# Patient Record
Sex: Male | Born: 1960 | Race: White | Hispanic: No | Smoking: Former smoker
Health system: Southern US, Community
[De-identification: ages and names within clinical notes are randomized; demographics above are authoritative.]

## PROBLEM LIST (undated history)

## (undated) DIAGNOSIS — M1990 Unspecified osteoarthritis, unspecified site: Secondary | ICD-10-CM

## (undated) DIAGNOSIS — R002 Palpitations: Secondary | ICD-10-CM

## (undated) DIAGNOSIS — F319 Bipolar disorder, unspecified: Secondary | ICD-10-CM

## (undated) DIAGNOSIS — K219 Gastro-esophageal reflux disease without esophagitis: Secondary | ICD-10-CM

## (undated) DIAGNOSIS — R0789 Other chest pain: Secondary | ICD-10-CM

## (undated) DIAGNOSIS — K759 Inflammatory liver disease, unspecified: Secondary | ICD-10-CM

## (undated) HISTORY — DX: Palpitations: R00.2

## (undated) HISTORY — PX: BACK SURGERY: SHX140

## (undated) HISTORY — PX: APPENDECTOMY: SHX54

## (undated) HISTORY — PX: COLONOSCOPY: SHX174

## (undated) HISTORY — PX: REPAIR OF FRACTURED PENIS: SHX6063

## (undated) HISTORY — DX: Other chest pain: R07.89

---

## 2008-04-14 ENCOUNTER — Observation Stay: Payer: Self-pay | Admitting: Internal Medicine

## 2008-04-14 ENCOUNTER — Other Ambulatory Visit: Payer: Self-pay

## 2011-09-17 ENCOUNTER — Other Ambulatory Visit: Payer: Self-pay | Admitting: Family Medicine

## 2011-09-17 DIAGNOSIS — N5089 Other specified disorders of the male genital organs: Secondary | ICD-10-CM

## 2011-09-18 ENCOUNTER — Other Ambulatory Visit: Payer: Self-pay

## 2011-09-19 ENCOUNTER — Ambulatory Visit
Admission: RE | Admit: 2011-09-19 | Discharge: 2011-09-19 | Disposition: A | Discharge status: Home / Self Care | Payer: PRIVATE HEALTH INSURANCE | Source: Ambulatory Visit | Attending: Family Medicine | Admitting: Family Medicine

## 2011-09-19 DIAGNOSIS — N5089 Other specified disorders of the male genital organs: Secondary | ICD-10-CM

## 2012-10-22 ENCOUNTER — Ambulatory Visit
Admission: RE | Admit: 2012-10-22 | Discharge: 2012-10-22 | Disposition: A | Discharge status: Home / Self Care | Payer: Self-pay | Source: Ambulatory Visit | Attending: Physician Assistant | Admitting: Physician Assistant

## 2012-10-22 ENCOUNTER — Other Ambulatory Visit: Payer: Self-pay | Admitting: Physician Assistant

## 2012-10-22 DIAGNOSIS — M545 Low back pain, unspecified: Secondary | ICD-10-CM

## 2012-11-30 ENCOUNTER — Encounter: Payer: PRIVATE HEALTH INSURANCE | Admitting: Internal Medicine

## 2015-10-14 ENCOUNTER — Emergency Department (HOSPITAL_COMMUNITY): Payer: BLUE CROSS/BLUE SHIELD

## 2015-10-14 ENCOUNTER — Emergency Department (HOSPITAL_COMMUNITY)
Admission: EM | Admit: 2015-10-14 | Discharge: 2015-10-14 | Disposition: A | Discharge status: Home / Self Care | Payer: BLUE CROSS/BLUE SHIELD | Attending: Emergency Medicine | Admitting: Emergency Medicine

## 2015-10-14 ENCOUNTER — Encounter (HOSPITAL_COMMUNITY): Payer: Self-pay | Admitting: Emergency Medicine

## 2015-10-14 DIAGNOSIS — Z79899 Other long term (current) drug therapy: Secondary | ICD-10-CM | POA: Diagnosis not present

## 2015-10-14 DIAGNOSIS — F319 Bipolar disorder, unspecified: Secondary | ICD-10-CM | POA: Diagnosis not present

## 2015-10-14 DIAGNOSIS — Z87891 Personal history of nicotine dependence: Secondary | ICD-10-CM | POA: Diagnosis not present

## 2015-10-14 DIAGNOSIS — Z7982 Long term (current) use of aspirin: Secondary | ICD-10-CM | POA: Diagnosis not present

## 2015-10-14 DIAGNOSIS — R002 Palpitations: Secondary | ICD-10-CM | POA: Diagnosis not present

## 2015-10-14 LAB — I-STAT TROPONIN, ED: TROPONIN I, POC: 0 ng/mL (ref 0.00–0.08)

## 2015-10-14 LAB — BASIC METABOLIC PANEL
Anion gap: 10 (ref 5–15)
BUN: 12 mg/dL (ref 6–20)
CO2: 25 mmol/L (ref 22–32)
CREATININE: 1.02 mg/dL (ref 0.61–1.24)
Calcium: 8.7 mg/dL — ABNORMAL LOW (ref 8.9–10.3)
Chloride: 105 mmol/L (ref 101–111)
GFR calc Af Amer: >60 mL/min (ref >60)
GFR calc non Af Amer: >60 mL/min (ref >60)
GLUCOSE: 179 mg/dL — AB (ref 65–99)
POTASSIUM: 3.4 mmol/L — AB (ref 3.5–5.1)
SODIUM: 140 mmol/L (ref 135–145)

## 2015-10-14 LAB — CBC
HCT: 43.3 % (ref 39.0–52.0)
HEMOGLOBIN: 14.9 g/dL (ref 13.0–17.0)
MCH: 30.7 pg (ref 26.0–34.0)
MCHC: 34.4 g/dL (ref 30.0–36.0)
MCV: 89.1 fL (ref 78.0–100.0)
Platelets: 174 10*3/uL (ref 150–400)
RBC: 4.86 MIL/uL (ref 4.22–5.81)
RDW: 12.6 % (ref 11.5–15.5)
WBC: 7.4 10*3/uL (ref 4.0–10.5)

## 2015-10-14 NOTE — ED Provider Notes (Signed)
CSN: [id number]     Arrival date & time 10/14/15  1615 History   First MD Initiated Contact with Patient 10/14/15 1948     Chief Complaint  Patient presents with  . Palpitations     (Consider location/radiation/quality/duration/timing/severity/associated sxs/prior Treatment) HPI Mr. Barry Davis is a 55 year old man with a history of bipolar disorder who comes today complaining of episodes of palpitations. He states that he began having these several weeks ago and has had 4 episodes. Last episode woke him up at 2 AM last night. He states it felt like his heart was pounding. He did not have associated chest pain, dizziness, lightheadedness, or weakness. This is similar to the other episodes he has had. His physician has started him on Toprol 25 mg recently. He takes Seroquel at bedtime. He has been taking the Seroquel for an extended period time. He had a history of smoking but quit last April. He states that he was drinking caffeine and had some palpitations after that but quit drinking caffeine with to go after that occurred. He denies any illicit drugs. He denies alcohol. History reviewed. No pertinent past medical history. Past Surgical History  Procedure Laterality Date  . Appendectomy    . Repair of fractured penis    . Back surgery     No family history on file. Social History  Substance Use Topics  . Smoking status: Former Games developer  . Smokeless tobacco: None  . Alcohol Use: No    Review of Systems  All other systems reviewed and are negative.     Allergies  Sulfa antibiotics  Home Medications   Prior to Admission medications   Medication Sig Start Date End Date Taking? Authorizing Provider  aspirin EC 81 MG tablet Take 81 mg by mouth daily.   Yes Historical Provider, MD  Guaifenesin (MUCINEX MAXIMUM STRENGTH) 1200 MG TB12 Take 1,200 mg by mouth every 12 (twelve) hours.   Yes Historical Provider, MD  metoprolol succinate (TOPROL-XL) 25 MG 24 hr tablet Take 25 mg by mouth  daily. To prevent palpitations 10/04/15  Yes Historical Provider, MD  Multiple Vitamins-Minerals (PRESERVISION AREDS 2 PO) Take 1 tablet by mouth 2 (two) times daily.   Yes Historical Provider, MD  QUEtiapine (SEROQUEL) 300 MG tablet Take 300 mg by mouth at bedtime. 10/06/15  Yes Historical Provider, MD   BP 143/92 mmHg  Pulse 83  Temp(Src) 98.7 F (37.1 C) (Oral)  Resp 13  SpO2 100% Physical Exam  Constitutional: He is oriented to person, place, and time. He appears well-developed and well-nourished.  HENT:  Head: Normocephalic and atraumatic.  Right Ear: External ear normal.  Left Ear: External ear normal.  Nose: Nose normal.  Mouth/Throat: Oropharynx is clear and moist.  Eyes: Conjunctivae and EOM are normal. Pupils are equal, round, and reactive to light.  Neck: Normal range of motion. Neck supple.  Cardiovascular: Normal rate, regular rhythm, normal heart sounds and intact distal pulses.   Pulmonary/Chest: Effort normal and breath sounds normal. No respiratory distress. He has no wheezes. He exhibits no tenderness.  Abdominal: Soft. Bowel sounds are normal. He exhibits no distension and no mass. There is no tenderness. There is no guarding.  Musculoskeletal: Normal range of motion.  Neurological: He is alert and oriented to person, place, and time. He has normal reflexes. He exhibits normal muscle tone. Coordination normal.  Skin: Skin is warm and dry.  Psychiatric: He has a normal mood and affect. His behavior is normal. Judgment and thought content normal.  Nursing note and vitals reviewed.   ED Course  Procedures (including critical care time) Labs Review Labs Reviewed  BASIC METABOLIC PANEL - Abnormal; Notable for the following:    Potassium 3.4 (*)    Glucose, Bld 179 (*)    Calcium 8.7 (*)    All other components within normal limits  CBC  I-STAT TROPOININ, ED    Imaging Review Dg Chest 2 View  10/14/2015  CLINICAL DATA:  Cardiac palpitations.  Left upper extremity  pain EXAM: CHEST  2 VIEW COMPARISON:  None. FINDINGS: Lungs are clear. Heart size and pulmonary vascularity are normal. No adenopathy. No pneumothorax. No bone lesions. IMPRESSION: No edema or consolidation. Electronically Signed   By: Bretta BangWilliam  Woodruff III M.D.   On: 10/14/2015 17:39   I have personally reviewed and evaluated these images and lab results as part of my medical decision-making.   EKG Interpretation   Date/Time:  Saturday October 14 2015 16:20:31 EST Ventricular Rate:  94 PR Interval:  166 QRS Duration: 90 QT Interval:  338 QTC Calculation: 422 R Axis:   67 Text Interpretation:  Normal sinus rhythm with sinus arrhythmia Normal ECG  Confirmed by Melah Ebling MD, Duwayne HeckANIELLE ([id number]) on 10/14/2015 8:31:22 PM      MDM   Final diagnoses:  Palpitations        Margarita Grizzleanielle Earnest Thalman, MD 10/14/15 2033

## 2015-10-14 NOTE — ED Notes (Addendum)
Pt reports that he began having heart palpitations this morning at 200 am and they continued through the day. Pt reports pain in eft arm but denies chest pain. NAD at this time. Pt alert x4. Pt also reports chest congestion.

## 2015-10-14 NOTE — Discharge Instructions (Signed)

## 2015-10-30 ENCOUNTER — Emergency Department (HOSPITAL_COMMUNITY)
Admission: EM | Admit: 2015-10-30 | Discharge: 2015-10-30 | Disposition: A | Discharge status: Home / Self Care | Payer: BLUE CROSS/BLUE SHIELD | Attending: Emergency Medicine | Admitting: Emergency Medicine

## 2015-10-30 ENCOUNTER — Emergency Department (HOSPITAL_COMMUNITY): Payer: BLUE CROSS/BLUE SHIELD

## 2015-10-30 ENCOUNTER — Encounter (HOSPITAL_COMMUNITY): Payer: Self-pay | Admitting: Emergency Medicine

## 2015-10-30 DIAGNOSIS — F319 Bipolar disorder, unspecified: Secondary | ICD-10-CM | POA: Diagnosis not present

## 2015-10-30 DIAGNOSIS — Z7982 Long term (current) use of aspirin: Secondary | ICD-10-CM | POA: Diagnosis not present

## 2015-10-30 DIAGNOSIS — F419 Anxiety disorder, unspecified: Secondary | ICD-10-CM

## 2015-10-30 DIAGNOSIS — R002 Palpitations: Secondary | ICD-10-CM

## 2015-10-30 DIAGNOSIS — Z87891 Personal history of nicotine dependence: Secondary | ICD-10-CM | POA: Insufficient documentation

## 2015-10-30 DIAGNOSIS — Z79899 Other long term (current) drug therapy: Secondary | ICD-10-CM | POA: Diagnosis not present

## 2015-10-30 DIAGNOSIS — F1210 Cannabis abuse, uncomplicated: Secondary | ICD-10-CM | POA: Diagnosis not present

## 2015-10-30 DIAGNOSIS — R079 Chest pain, unspecified: Secondary | ICD-10-CM | POA: Diagnosis present

## 2015-10-30 HISTORY — DX: Bipolar disorder, unspecified: F31.9

## 2015-10-30 LAB — CBC WITH DIFFERENTIAL/PLATELET
BASOS PCT: 0 %
Basophils Absolute: 0 10*3/uL (ref 0.0–0.1)
EOS ABS: 0 10*3/uL (ref 0.0–0.7)
EOS PCT: 0 %
HEMATOCRIT: 48.6 % (ref 39.0–52.0)
Hemoglobin: 16.8 g/dL (ref 13.0–17.0)
Lymphocytes Relative: 23 %
Lymphs Abs: 2.6 10*3/uL (ref 0.7–4.0)
MCH: 30.9 pg (ref 26.0–34.0)
MCHC: 34.6 g/dL (ref 30.0–36.0)
MCV: 89.5 fL (ref 78.0–100.0)
MONO ABS: 0.5 10*3/uL (ref 0.1–1.0)
MONOS PCT: 4 %
NEUTROS ABS: 8.2 10*3/uL — AB (ref 1.7–7.7)
Neutrophils Relative %: 73 %
PLATELETS: 247 10*3/uL (ref 150–400)
RBC: 5.43 MIL/uL (ref 4.22–5.81)
RDW: 12.6 % (ref 11.5–15.5)
WBC: 11.3 10*3/uL — ABNORMAL HIGH (ref 4.0–10.5)

## 2015-10-30 LAB — I-STAT TROPONIN, ED: TROPONIN I, POC: 0 ng/mL (ref 0.00–0.08)

## 2015-10-30 LAB — I-STAT CHEM 8, ED
BUN: 15 mg/dL (ref 6–20)
CALCIUM ION: 1.07 mmol/L — AB (ref 1.12–1.23)
CREATININE: 0.9 mg/dL (ref 0.61–1.24)
Chloride: 104 mmol/L (ref 101–111)
Glucose, Bld: 143 mg/dL — ABNORMAL HIGH (ref 65–99)
HEMATOCRIT: 53 % — AB (ref 39.0–52.0)
Hemoglobin: 18 g/dL — ABNORMAL HIGH (ref 13.0–17.0)
Potassium: 3.4 mmol/L — ABNORMAL LOW (ref 3.5–5.1)
Sodium: 142 mmol/L (ref 135–145)
TCO2: 25 mmol/L (ref 0–100)

## 2015-10-30 LAB — RAPID URINE DRUG SCREEN, HOSP PERFORMED
AMPHETAMINES: NOT DETECTED
BENZODIAZEPINES: NOT DETECTED
Barbiturates: NOT DETECTED
COCAINE: NOT DETECTED
Opiates: NOT DETECTED
Tetrahydrocannabinol: POSITIVE — AB

## 2015-10-30 LAB — D-DIMER, QUANTITATIVE (NOT AT ARMC)

## 2015-10-30 MED ORDER — HYDROXYZINE HCL 25 MG PO TABS: 25.0000 mg | ORAL_TABLET | Freq: Four times a day (QID) | ORAL | Status: DC | PRN

## 2015-10-30 MED ORDER — SODIUM CHLORIDE 0.9 % IV BOLUS (SEPSIS)
1000.0000 mL | Freq: Once | INTRAVENOUS | Status: AC
  Administered 2015-10-30: 1000 mL via INTRAVENOUS

## 2015-10-30 MED ORDER — HYDROXYZINE HCL 25 MG PO TABS
25.0000 mg | ORAL_TABLET | Freq: Once | ORAL | Status: AC
  Administered 2015-10-30: 25 mg via ORAL
  Filled 2015-10-30: qty 1

## 2015-10-30 NOTE — Discharge Instructions (Signed)

## 2015-10-30 NOTE — ED Provider Notes (Signed)
CSN: [id number]     Arrival date & time 10/30/15  4098 History   First MD Initiated Contact with Patient 10/30/15 [phone]     Chief Complaint  Patient presents with  . Chest Pain     (Consider location/radiation/quality/duration/timing/severity/associated sxs/prior Treatment) HPI   55 year old male with history of bipolar presents with complaints of heart palpitation. This report for the past 3 days he has had experiencing heart palpitation, tremors, difficulty sleeping, feeling anxious. These episodes waxing waning and felt similar recent episodes when he was seen in the ED on January 14 when he was diagnosed with heart palpitation. Patient denies having fever, pleuritic chest pain, productive cough, lightheadedness, dizziness, diaphoresis. Patient correlate his symptoms to his Seroquel in which he takes for bipolar. He discontinued the medication 3 days ago. Patient states that he has tried to avoid alcohol, cigarette, smoking pot, and caffeine use for the past several months but his symptoms are getting progressively worse. He denies any significant cardiac history. Patient denies SI/HI/visual and auditory hallucination. Denies any recent alcohol use or illicit drug use. Patient states his doctor has started him on a beta blocker for his heart palpitation which provide some improvement. Patient admits that he is feeling stressed out due to his heart palpitation and believes it is a side effect of Seroquel.    Past Medical History  Diagnosis Date  . Bipolar 1 disorder [facility])    Past Surgical History  Procedure Laterality Date  . Appendectomy    . Repair of fractured penis    . Back surgery     No family history on file. Social History  Substance Use Topics  . Smoking status: Former Games developer  . Smokeless tobacco: None  . Alcohol Use: No    Review of Systems  All other systems reviewed and are negative.     Allergies  Sulfa antibiotics  Home Medications   Prior to Admission  medications   Medication Sig Start Date End Date Taking? Authorizing Provider  aspirin EC 81 MG tablet Take 81 mg by mouth daily.    Historical Provider, MD  Guaifenesin (MUCINEX MAXIMUM STRENGTH) 1200 MG TB12 Take 1,200 mg by mouth every 12 (twelve) hours.    Historical Provider, MD  metoprolol succinate (TOPROL-XL) 25 MG 24 hr tablet Take 25 mg by mouth daily. To prevent palpitations 10/04/15   Historical Provider, MD  Multiple Vitamins-Minerals (PRESERVISION AREDS 2 PO) Take 1 tablet by mouth 2 (two) times daily.    Historical Provider, MD  QUEtiapine (SEROQUEL) 300 MG tablet Take 300 mg by mouth at bedtime. 10/06/15   Historical Provider, MD   BP 146/98 mmHg  Pulse 75  Temp(Src) 98.1 F (36.7 C) (Oral)  Resp 18  SpO2 100% Physical Exam  Constitutional: He is oriented to person, place, and time. He appears well-developed and well-nourished. No distress.  Caucasian male, appears anxious but nontoxic  HENT:  Head: Atraumatic.  Eyes: Conjunctivae are normal.  Neck: Neck supple.  Cardiovascular: Normal rate, regular rhythm and intact distal pulses.   Pulmonary/Chest: Effort normal and breath sounds normal. No respiratory distress.  Abdominal: Soft. There is no tenderness.  Musculoskeletal: He exhibits no edema.  Neurological: He is alert and oriented to person, place, and time.  Skin: No rash noted.  Psychiatric: He has a normal mood and affect. Thought content is not paranoid. He expresses no homicidal and no suicidal ideation.  Nursing note and vitals reviewed.   ED Course  Procedures (including critical care time) Labs  Review Labs Reviewed  CBC WITH DIFFERENTIAL/PLATELET - Abnormal; Notable for the following:    WBC 11.3 (*)    Neutro Abs 8.2 (*)    All other components within normal limits  URINE RAPID DRUG SCREEN, HOSP PERFORMED - Abnormal; Notable for the following:    Tetrahydrocannabinol POSITIVE (*)    All other components within normal limits  I-STAT CHEM 8, ED -  Abnormal; Notable for the following:    Potassium 3.4 (*)    Glucose, Bld 143 (*)    Calcium, Ion 1.07 (*)    Hemoglobin 18.0 (*)    HCT 53.0 (*)    All other components within normal limits  D-DIMER, QUANTITATIVE (NOT AT [facility])  I-STAT TROPOININ, ED    Imaging Review Dg Chest 2 View  10/30/2015  CLINICAL DATA:  Heart palpitations for 3 days, mid to left chest pain, sweating since last night EXAM: CHEST  2 VIEW COMPARISON:  None. FINDINGS: The heart size and mediastinal contours are within normal limits. Both lungs are clear. The visualized skeletal structures are unremarkable. IMPRESSION: No active cardiopulmonary disease. Electronically Signed   By: Esperanza Heir M.D.   On: 10/30/2015 10:16   I have personally reviewed and evaluated these images and lab results as part of my medical decision-making.   EKG Interpretation   Date/Time:  Monday October 30 2015 08:44:51 EST Ventricular Rate:  107 PR Interval:  142 QRS Duration: 95 QT Interval:  348 QTC Calculation: 464 R Axis:   75 Text Interpretation:  Sinus tachycardia Probable left atrial enlargement  Probable inferior infarct, old Abnormal ekg Confirmed by BEATON  MD,  ROBERT ([id number]) on 10/30/2015 9:54:59 AM      MDM   Final diagnoses:  Heart palpitations  Anxiety    BP 115/78 mmHg  Pulse 65  Temp(Src) 98.1 F (36.7 C) (Oral)  Resp 13  SpO2 96%   9:12 AM   history of bipolar here with heart palpitation in which he related to taking Seroquel. He does not have any significant cardiac history. No obvious risk factor for PE and denies any shortness of breath. no recent stimulant use. Was seen on 1/14 for similar complaint.  Work up initiated. Care discussed with Dr. Radford Pax.   11:32 AM EKG shows mild tachycardia which has since resolved. Patient's labs are reassuring.  negative d-dimer and therefore risks of PE is low. UDS positive for tetrahydrocannabinol. Chest x-ray reassuring. His electrolytes are reassuring.  Patient felt much better after receiving hydroxyzine for his anxiety. Patient will be discharge with the same medication and will follow-up with his doctor for further management. Return precaution discussed.   Fayrene Helper, PA-C 10/30/15 1135  Nelva Nay, MD 11/01/15 [phone]

## 2015-10-30 NOTE — ED Notes (Signed)
Pt states he has been on seroquel for 2 years.  Pt states that his heart was affected so his doctor took him off of it.  Pt states that he was switched to trazodone.  Still having "heart beating out of chest, shakiness, and not sleeping for 3 days".  Pt concerned that he is going to die.  Pt continues to ask "Will this kill me? Will this kill me?".  Pt reassured that he is in a safe place and we will rule out any life threatening causes of his chest pressure and palpitations.

## 2015-11-01 ENCOUNTER — Ambulatory Visit (INDEPENDENT_AMBULATORY_CARE_PROVIDER_SITE_OTHER): Payer: BLUE CROSS/BLUE SHIELD | Admitting: Cardiovascular Disease

## 2015-11-01 ENCOUNTER — Ambulatory Visit (INDEPENDENT_AMBULATORY_CARE_PROVIDER_SITE_OTHER): Payer: BLUE CROSS/BLUE SHIELD

## 2015-11-01 ENCOUNTER — Encounter: Payer: Self-pay | Admitting: Cardiovascular Disease

## 2015-11-01 VITALS — BP 138/80 | HR 76 | Ht 70.0 in | Wt 161.8 lb

## 2015-11-01 DIAGNOSIS — R0789 Other chest pain: Secondary | ICD-10-CM

## 2015-11-01 DIAGNOSIS — R079 Chest pain, unspecified: Secondary | ICD-10-CM

## 2015-11-01 DIAGNOSIS — R002 Palpitations: Secondary | ICD-10-CM

## 2015-11-01 NOTE — Patient Instructions (Signed)
Medication Instructions:  Your physician recommends that you continue on your current medications as directed. Please refer to the Current Medication list given to you today.   Labwork: none  Testing/Procedures: Your physician has recommended that you wear an event monitor. Event monitors are medical devices that record the heart's electrical activity. Doctors most often [place] these monitors to diagnose arrhythmias. Arrhythmias are problems with the speed or rhythm of the heartbeat. The monitor is a small, portable device. You can wear one while you do your normal daily activities. This is usually used to diagnose what is causing palpitations/syncope (passing out). 2 [facility]  Your physician has requested that you have en exercise stress myoview. For further information please visit [email or web address] Please follow instruction sheet, as given.  Your physician has requested that you have an echocardiogram. Echocardiography is a painless test that uses sound waves to create images of your heart. It provides your doctor with information about the size and shape of your heart and how well your heart's chambers and valves are working. This procedure takes approximately one hour. There are no restrictions for this procedure.    Follow-Up: Your physician recommends that you schedule a follow-up appointment in: 4 weeks with Dr. Allyson Sabal   Any Other Special Instructions Will Be Listed Below (If Applicable).     If you need a refill on your cardiac medications before your next appointment, please call your pharmacy.

## 2015-11-01 NOTE — Assessment & Plan Note (Signed)
Barry Davis has atypical chest pain. The pain lasts for all day and is on both sides of his chest. Does have a 40-pack-year history of tobacco abuse. I'm going to get an exercise Myoview stress test to rule out an ischemic etiology.

## 2015-11-01 NOTE — Progress Notes (Signed)
11/01/2015 Barry Davis   January 25, 1961  [id number]  Primary Physician Barry Hoit, MD Primary Cardiologist: Barry Gess MD Barry Davis   HPI:  Mr. 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 He is referred by the emergency room because of palpitations and atypical chest pain. His primary care physician is Dr. Benedetto Davis. This was factors include 40-pack-years tobacco abuse having quit 8 months ago. Does smoke occasional pot. There no other cardiovascular risk factors. He did have hepatitis as a child. He has bipolar disorder and anxiety and has been on Seroquel which was recently discontinued this resulted in lack of sleep, increased anxiety and tachycardia palpitations as well as atypical chest pain.   Current Outpatient Prescriptions  Medication Sig Dispense Refill  . ascorbic acid (VITAMIN C) 250 MG CHEW Chew 500 mg by mouth daily.    Marland Kitchen aspirin EC 81 MG tablet Take 81 mg by mouth daily.    . hydrOXYzine (ATARAX/VISTARIL) 25 MG tablet Take 1 tablet (25 mg total) by mouth every 6 (six) hours as needed for anxiety. 12 tablet 0  . metoprolol succinate (TOPROL-XL) 25 MG 24 hr tablet Take 25 mg by mouth daily. To prevent palpitations  5  . Multiple Vitamins-Minerals (PRESERVISION AREDS 2 PO) Take 1 tablet by mouth 2 (two) times daily.     No current facility-administered medications for this visit.    Allergies  Allergen Reactions  . Seroquel [Quetiapine] Palpitations  . Sulfa Antibiotics Anaphylaxis    Couldn't breathe    Social History   Social History  . Marital Status: Divorced    Spouse Name: N/A  . Number of Children: N/A  . Years of Education: N/A   Occupational History  . Not on file.   Social History Main Topics  . Smoking status: Former Games developer  . Smokeless tobacco: Not on file  . Alcohol Use: No  . Drug Use: No  . Sexual Activity: Not on file   Other Topics Concern  . Not on  file   Social History Narrative     Review of Systems: General: negative for chills, fever, night sweats or weight changes.  Cardiovascular: negative for chest pain, dyspnea on exertion, edema, orthopnea, palpitations, paroxysmal nocturnal dyspnea or shortness of breath Dermatological: negative for rash Respiratory: negative for cough or wheezing Urologic: negative for hematuria Abdominal: negative for nausea, vomiting, diarrhea, bright red blood per rectum, melena, or hematemesis Neurologic: negative for visual changes, syncope, or dizziness All other systems reviewed and are otherwise negative except as noted above.    Blood pressure 138/80, pulse 76, height  (1.778 m), weight 161 lb 12.8 oz (73.392 kg).  General appearance: alert and no distress Neck: no adenopathy, no carotid bruit, no JVD, supple, symmetrical, trachea midline and thyroid not enlarged, symmetric, no tenderness/mass/nodules Lungs: clear to auscultation bilaterally Heart: regular rate and rhythm, S1, S2 normal, no murmur, click, rub or gallop Extremities: extremities normal, atraumatic, no cyanosis or edema  EKG not performed today  ASSESSMENT AND PLAN:   Palpitations Mr. Barry Davis has palpitations. He is placed on low-dose beta blocker by his PCP. These were exacerbated by discontinuing his Seroquel. We will check a 2 week event monitor.  Atypical chest pain Mr. Barry Davis has atypical chest pain. The pain lasts for all day and is on both sides of his chest. Does have a 40-pack-year history of tobacco abuse. I'm going to get an exercise Myoview stress test to rule  out an ischemic etiology.      Barry Gess MD FACP,FACC,FAHA, [facility] 11/01/2015 4:17 PM'

## 2015-11-01 NOTE — Assessment & Plan Note (Signed)
Barry Davis has palpitations. He is placed on low-dose beta blocker by his PCP. These were exacerbated by discontinuing his Seroquel. We will check a 2 week event monitor.

## 2015-11-02 ENCOUNTER — Telehealth: Payer: Self-pay | Admitting: Cardiovascular Disease

## 2015-11-02 NOTE — Telephone Encounter (Signed)
Pt aware to stay on beta blocker per physician notes.

## 2015-11-02 NOTE — Telephone Encounter (Signed)
Please let him know if he needs to continue to take his Beta Blocker?

## 2015-11-03 ENCOUNTER — Ambulatory Visit: Payer: PRIVATE HEALTH INSURANCE | Admitting: Internal Medicine

## 2015-11-09 ENCOUNTER — Telehealth: Payer: Self-pay | Admitting: Cardiovascular Disease

## 2015-11-09 NOTE — Telephone Encounter (Signed)
OK to get a GXT

## 2015-11-09 NOTE — Telephone Encounter (Signed)
Returned call to patient. Pt states insurance [facility]) requesting info for nuc stress. (apparently will not cover, but will cover GXT)  Pt provided number for [employer] ([employer]) - for appeal.  I called number provided - [phone]. Spoke to Harris, account rep.  He states that the claim was denied.: If nuc is needed, physician can call in and request peer to peer provider courtesy review Would dial [phone] -  do option 1 for radiology and then option 2 for peer to peer review.  Routed to Dr. Allyson Sabal and nurse for recommendations.

## 2015-11-09 NOTE — Telephone Encounter (Signed)
Informed pt GXT sufficient for concerns as reviewed by Dr. Allyson Sabal.  Additional questions addressed for patient, who verbalized understanding. Message sent to scheduling.

## 2015-11-09 NOTE — Telephone Encounter (Signed)
Patient's iinsurance will not pay for nuclear stress test so per Dr. Allyson Sabal we changed to GXT.  Patient wants to make sure gxt will show what is wrong with his heart.  He is upset.Marland KitchenMarland Kitchen

## 2015-11-14 DIAGNOSIS — R002 Palpitations: Secondary | ICD-10-CM | POA: Diagnosis not present

## 2015-11-21 ENCOUNTER — Other Ambulatory Visit: Payer: Self-pay

## 2015-11-21 DIAGNOSIS — R002 Palpitations: Secondary | ICD-10-CM

## 2015-11-21 DIAGNOSIS — R079 Chest pain, unspecified: Secondary | ICD-10-CM

## 2015-11-22 ENCOUNTER — Ambulatory Visit (HOSPITAL_COMMUNITY): Payer: BLUE CROSS/BLUE SHIELD | Attending: Cardiology

## 2015-11-22 ENCOUNTER — Encounter (HOSPITAL_COMMUNITY): Payer: PRIVATE HEALTH INSURANCE

## 2015-11-22 ENCOUNTER — Other Ambulatory Visit: Payer: Self-pay

## 2015-11-22 DIAGNOSIS — I517 Cardiomegaly: Secondary | ICD-10-CM | POA: Insufficient documentation

## 2015-11-22 DIAGNOSIS — R002 Palpitations: Secondary | ICD-10-CM | POA: Insufficient documentation

## 2015-11-22 DIAGNOSIS — R079 Chest pain, unspecified: Secondary | ICD-10-CM | POA: Diagnosis not present

## 2015-11-22 DIAGNOSIS — Z87891 Personal history of nicotine dependence: Secondary | ICD-10-CM | POA: Diagnosis not present

## 2015-11-29 ENCOUNTER — Ambulatory Visit (HOSPITAL_COMMUNITY)
Admission: RE | Admit: 2015-11-29 | Discharge: 2015-11-29 | Disposition: A | Discharge status: Home / Self Care | Payer: BLUE CROSS/BLUE SHIELD | Source: Ambulatory Visit | Attending: Cardiology | Admitting: Cardiology

## 2015-11-29 DIAGNOSIS — R079 Chest pain, unspecified: Secondary | ICD-10-CM | POA: Diagnosis not present

## 2015-11-29 DIAGNOSIS — R002 Palpitations: Secondary | ICD-10-CM | POA: Diagnosis not present

## 2015-11-29 LAB — EXERCISE TOLERANCE TEST
CSEPHR: 98 %
CSEPPHR: 164 {beats}/min
Estimated workload: 10.1 METS
Exercise duration (min): 9 min
MPHR: 166 {beats}/min
RPE: 16
Rest HR: 76 {beats}/min

## 2015-12-06 ENCOUNTER — Ambulatory Visit (INDEPENDENT_AMBULATORY_CARE_PROVIDER_SITE_OTHER): Payer: BLUE CROSS/BLUE SHIELD | Admitting: Cardiovascular Disease

## 2015-12-06 ENCOUNTER — Ambulatory Visit: Payer: PRIVATE HEALTH INSURANCE | Admitting: Cardiovascular Disease

## 2015-12-06 ENCOUNTER — Encounter: Payer: Self-pay | Admitting: Cardiovascular Disease

## 2015-12-06 VITALS — BP 122/78 | HR 80 | Ht 71.0 in | Wt 175.0 lb

## 2015-12-06 DIAGNOSIS — R0789 Other chest pain: Secondary | ICD-10-CM

## 2015-12-06 DIAGNOSIS — R002 Palpitations: Secondary | ICD-10-CM

## 2015-12-06 NOTE — Assessment & Plan Note (Signed)
Barry Davis returns today for follow-up of his event monitor which showed PACs with one short run of PSVT. Since I saw him a month ago his palpitations have resolved. He did have a 2-D echo which was entirely normal as well.

## 2015-12-06 NOTE — Progress Notes (Signed)
Mr. Barry Davis returns for follow-up of his outpatient noninvasive workup of palpitations atypical chest pain. Palpitations have since resolved. His monitor showed PACs with one short run of PSVT. His 2-D echo was normal and his routine GXT was low risk. I reassured him that the likelihood that his symptoms are cardiac are small. I will see back as needed.

## 2015-12-06 NOTE — Patient Instructions (Signed)
Medication Instructions:  Your physician recommends that you continue on your current medications as directed. Please refer to the Current Medication list given to you today.   Labwork: none  Testing/Procedures: none  Follow-Up: Follow up with Dr. Berry as needed.   Any Other Special Instructions Will Be Listed Below (If Applicable).     If you need a refill on your cardiac medications before your next appointment, please call your pharmacy.   

## 2015-12-06 NOTE — Assessment & Plan Note (Signed)
Barry Davis had a routine GXT which was normal. His chest pain is atypical. I reassured him that the likelihood that this is heart related to small. I will see him back as needed.

## 2017-01-06 ENCOUNTER — Telehealth: Payer: Self-pay | Admitting: Cardiovascular Disease

## 2017-01-06 NOTE — Telephone Encounter (Signed)
Received records from [facility] for appointment on 01/31/17 with Dr Allyson Sabal.  Records put with Dr Hazle Coca schedule for 01/31/17. lp

## 2017-01-15 ENCOUNTER — Encounter: Payer: Self-pay | Admitting: Cardiology

## 2017-01-15 ENCOUNTER — Ambulatory Visit (INDEPENDENT_AMBULATORY_CARE_PROVIDER_SITE_OTHER): Payer: BLUE CROSS/BLUE SHIELD | Admitting: Cardiology

## 2017-01-15 VITALS — BP 112/66 | HR 80 | Ht 71.0 in | Wt 168.0 lb

## 2017-01-15 DIAGNOSIS — R002 Palpitations: Secondary | ICD-10-CM | POA: Diagnosis not present

## 2017-01-15 DIAGNOSIS — F419 Anxiety disorder, unspecified: Secondary | ICD-10-CM | POA: Diagnosis not present

## 2017-01-15 MED ORDER — METOPROLOL TARTRATE 25 MG PO TABS: 12.5000 mg | ORAL_TABLET | Freq: Two times a day (BID) | ORAL | 5 refills | Status: DC | PRN

## 2017-01-15 NOTE — Assessment & Plan Note (Signed)
Pt seen as an add on for palpitation- Prior normal echo and negative GXT Feb 2017 Holter -brief PSVT and PACs then.

## 2017-01-15 NOTE — Patient Instructions (Signed)
Medication Instructions:  Your physician recommends that you continue on your current medications as directed. Please refer to the Current Medication list given to you today.  If you need a refill on your cardiac medications before your next appointment, please call your pharmacy.  Testing/Procedures: Your physician has recommended that you wear an event monitor. Event monitors are medical devices that record the heart's electrical activity. Doctors most often [place] these monitors to diagnose arrhythmias. Arrhythmias are problems with the speed or rhythm of the heartbeat. The monitor is a small, portable device. You can wear one while you do your normal daily activities. This is usually used to diagnose what is causing palpitations/syncope (passing out).  This will be placed at our [facility] location - [address].  Follow-Up: Your physician wants you to follow-up in: 4-6 WEEKS WITH DR Allyson Sabal.    Thank you for choosing [facility] at [facility]!!

## 2017-01-15 NOTE — Assessment & Plan Note (Signed)
Followed by PCP

## 2017-01-15 NOTE — Progress Notes (Signed)
01/15/2017 Barry Davis   [birth year]  [id number]  Primary Physician Boneta Lucks, NP Primary Cardiologist: Dr Allyson Sabal  HPI:  56 y/o with anxiety and palpitations. He was seen initially in Feb 2017. He had a Holter, echo, and GXT. The Holter showed PACs and brief PSVT. The pt was reassured and his symptoms abated till a week ago when he had an episode of brief tachycardia while driving. He was very symptomatic "I felt like I was going out". This was after he had a cup of coffee and smoked some marijuana.  The whole episode lasted less than 30 seconds. Since then he has noted palpitations which sounds like ectopy as opposed to tachycardia. He saw his PCP who referred him here for further evaluation. He denies any exertional symptoms.    Current Outpatient Prescriptions  Medication Sig Dispense Refill  . hydrOXYzine (ATARAX/VISTARIL) 25 MG tablet Take 25 mg by mouth every 6 (six) hours as needed.    . Multiple Vitamins-Minerals (ICAPS AREDS 2 PO) Take 1 capsule by mouth daily.    . metoprolol tartrate (LOPRESSOR) 25 MG tablet Take 0.5 tablets (12.5 mg total) by mouth 2 (two) times daily as needed (PRN PALPITATIONS). 30 tablet 5   No current facility-administered medications for this visit.     Allergies  Allergen Reactions  . Seroquel [Quetiapine] Palpitations  . Sulfa Antibiotics Anaphylaxis    Couldn't breathe    Past Medical History:  Diagnosis Date  . Atypical chest pain   . Bipolar 1 disorder (HCC)   . Palpitations     Social History   Social History  . Marital status: Divorced    Spouse name: N/A  . Number of children: N/A  . Years of education: N/A   Occupational History  . Not on file.   Social History Main Topics  . Smoking status: Former Games developer  . Smokeless tobacco: Never Used  . Alcohol use No  . Drug use: No  . Sexual activity: Not on file   Other Topics Concern  . Not on file   Social History Narrative  . No narrative on file     Family  History  Problem Relation Age of Onset  . Atrial fibrillation Father   . CVA Father   . Bladder Cancer Brother   . Renal cancer Brother      Review of Systems: General: negative for chills, fever, night sweats or weight changes.  Cardiovascular: negative for chest pain, dyspnea on exertion, edema, orthopnea, paroxysmal nocturnal dyspnea or shortness of breath Dermatological: negative for rash Respiratory: negative for cough or wheezing Urologic: negative for hematuria Abdominal: negative for nausea, vomiting, diarrhea, bright red blood per rectum, melena, or hematemesis Neurologic: negative for visual changes, syncope, or dizziness All other systems reviewed and are otherwise negative except as noted above.    Blood pressure 112/66, pulse 80, height  (1.803 m), weight 168 lb (76.2 kg).  General appearance: alert, cooperative, no distress and anxious Neck: no carotid bruit and no JVD Lungs: clear to auscultation bilaterally Heart: regular rate and rhythm Extremities: extremities normal, atraumatic, no cyanosis or edema Pulses: 2+ and symmetric Skin: Skin color, texture, turgor normal. No rashes or lesions Neurologic: Grossly normal  EKG 01/03/17-NSR-HR 70  ASSESSMENT AND PLAN:   Palpitations Pt seen as an add on for palpitation- Prior normal echo and negative GXT Feb 2017 Holter -brief PSVT and PACs then.  Anxiety Followed by PCP   PLAN  I suggested the pt stop caffeine  and marijuana use. He's quite anxious about these epiosdes and I prescribed Lopressor 12.5 mg BID PRN if he has several palpitations in a day. I'll get a 7 day Holter and have him see Dr Allyson Sabal in follow up after this.   Corine Shelter PA-C 01/15/2017 2:37 PM

## 2017-01-23 ENCOUNTER — Ambulatory Visit (INDEPENDENT_AMBULATORY_CARE_PROVIDER_SITE_OTHER): Payer: BLUE CROSS/BLUE SHIELD

## 2017-01-23 DIAGNOSIS — R002 Palpitations: Secondary | ICD-10-CM

## 2017-01-31 ENCOUNTER — Ambulatory Visit: Payer: BLUE CROSS/BLUE SHIELD | Admitting: Cardiovascular Disease

## 2017-02-26 ENCOUNTER — Encounter: Payer: Self-pay | Admitting: Cardiovascular Disease

## 2017-02-26 ENCOUNTER — Ambulatory Visit (INDEPENDENT_AMBULATORY_CARE_PROVIDER_SITE_OTHER): Payer: BLUE CROSS/BLUE SHIELD | Admitting: Cardiovascular Disease

## 2017-02-26 ENCOUNTER — Encounter (INDEPENDENT_AMBULATORY_CARE_PROVIDER_SITE_OTHER): Payer: Self-pay

## 2017-02-26 VITALS — BP 114/72 | HR 82 | Ht 71.0 in | Wt 170.6 lb

## 2017-02-26 DIAGNOSIS — R002 Palpitations: Secondary | ICD-10-CM

## 2017-02-26 MED ORDER — METOPROLOL SUCCINATE ER 25 MG PO TB24: 12.5000 mg | ORAL_TABLET | Freq: Every day | ORAL | 6 refills | Status: DC

## 2017-02-26 NOTE — Progress Notes (Signed)
02/26/2017 Barry Davis   Nov 03, 1960  [id number]  Primary Physician Boneta Lucks, NP (Inactive) Primary Cardiologist: Runell Gess MD Roseanne Reno  HPI:  [name] is a 56 year old thin-appearing divorced/widowed Caucasian male with no children , Who works as a Building services engineer. He Was referred by the emergency room because of palpitations and atypical chest pain. I last saw him in the office 12/06/15.His primary care physician is Dr. Benedetto Goad. This was factors include 40-pack-years tobacco abuse having quit 8 months ago. Does smoke occasional pot. There no other cardiovascular risk factors. He did have hepatitis as a child. He has bipolar disorder and anxiety and has been on Seroquel which was recently discontinued this resulted in lack of sleep, increased anxiety and tachycardia palpitations as well as atypical chest pain. He has been well until last month when he had 2 episodes of tachycardia palpitations and pain going down his left arm. He saw Corine Shelter in the office who ordered another event monitor for/28 through 5/21 which shows sinus rhythm with PACs. This chest pain is noncardiac. He has stopped drinking coffee. He still Uses recreational marijuana.  Current Outpatient Prescriptions  Medication Sig Dispense Refill  . Ascorbic Acid (VITAMIN C) 100 MG CHEW Chew 1 tablet by mouth daily.    . metoprolol tartrate (LOPRESSOR) 25 MG tablet Take 0.5 tablets (12.5 mg total) by mouth 2 (two) times daily as needed (PRN PALPITATIONS). (Patient taking differently: Take 12.5 mg by mouth as directed. ) 30 tablet 5  . Multiple Vitamins-Minerals (ICAPS AREDS 2 PO) Take 1 capsule by mouth daily.     No current facility-administered medications for this visit.     Allergies  Allergen Reactions  . Seroquel [Quetiapine] Palpitations  . Sulfa Antibiotics Anaphylaxis    Couldn't breathe    Social History   Social History  . Marital status: Divorced    Spouse name: N/A  .  Number of children: N/A  . Years of education: N/A   Occupational History  . Not on file.   Social History Main Topics  . Smoking status: Former Games developer  . Smokeless tobacco: Never Used  . Alcohol use No  . Drug use: No  . Sexual activity: Not on file   Other Topics Concern  . Not on file   Social History Narrative  . No narrative on file     Review of Systems: General: negative for chills, fever, night sweats or weight changes.  Cardiovascular: negative for chest pain, dyspnea on exertion, edema, orthopnea, palpitations, paroxysmal nocturnal dyspnea or shortness of breath Dermatological: negative for rash Respiratory: negative for cough or wheezing Urologic: negative for hematuria Abdominal: negative for nausea, vomiting, diarrhea, bright red blood per rectum, melena, or hematemesis Neurologic: negative for visual changes, syncope, or dizziness All other systems reviewed and are otherwise negative except as noted above.    Blood pressure 114/72, pulse 82, height 5\' 11"  (1.803 m), weight 170 lb 9.6 oz (77.4 kg), SpO2 96 %.  General appearance: alert and no distress Neck: no adenopathy, no carotid bruit, no JVD, supple, symmetrical, trachea midline and thyroid not enlarged, symmetric, no tenderness/mass/nodules Lungs: clear to auscultation bilaterally Heart: regular rate and rhythm, S1, S2 normal, no murmur, click, rub or gallop Extremities: extremities normal, atraumatic, no cyanosis or edema  EKG Not performed today  ASSESSMENT AND PLAN:   Palpitations [name] was seen a year and a half ago for palpitations. An event monitor showed PACs. She had recurrent symptoms  last month and a second event monitor showed similar things. He has cut out his caffeine. He was begun on low-dose beta blocker.  Atypical chest pain Atypical noncardiac chest pain with negative GXT last year.      Runell GessJonathan J. Irem Stoneham MD FACP,FACC,FAHA, [facility] 02/26/2017 3:39 PM

## 2017-02-26 NOTE — Assessment & Plan Note (Signed)
Atypical noncardiac chest pain with negative GXT last year.

## 2017-02-26 NOTE — Assessment & Plan Note (Signed)
Mr. Barry Davis was seen a year and a half ago for palpitations. An event monitor showed PACs. She had recurrent symptoms last month and a second event monitor showed similar things. He has cut out his caffeine. He was begun on low-dose beta blocker.

## 2017-02-26 NOTE — Patient Instructions (Signed)
Medication Instructions: STOP Metoprolol tartrate.  START Metoprolol Succinate ER 25 mg--take 1/2 tablet (12.5mg ) daily.   Follow-Up: We request that you follow-up in: 3 months with Corine ShelterLuke Kilroy, PA and in 12 months with Dr San MorelleBerry  You will receive a reminder letter in the mail two months in advance. If you don't receive a letter, please call our office to schedule the follow-up appointment.  If you need a refill on your cardiac medications before your next appointment, please call your pharmacy.

## 2017-12-24 IMAGING — CR DG CHEST 2V
2 series · 2 of 2 positions shown · non-contrast
Comparison: None.

CLINICAL DATA: Heart palpitations for 3 days, mid to left chest
pain, sweating since last night

EXAM:
CHEST  2 VIEW

[w chest pa]
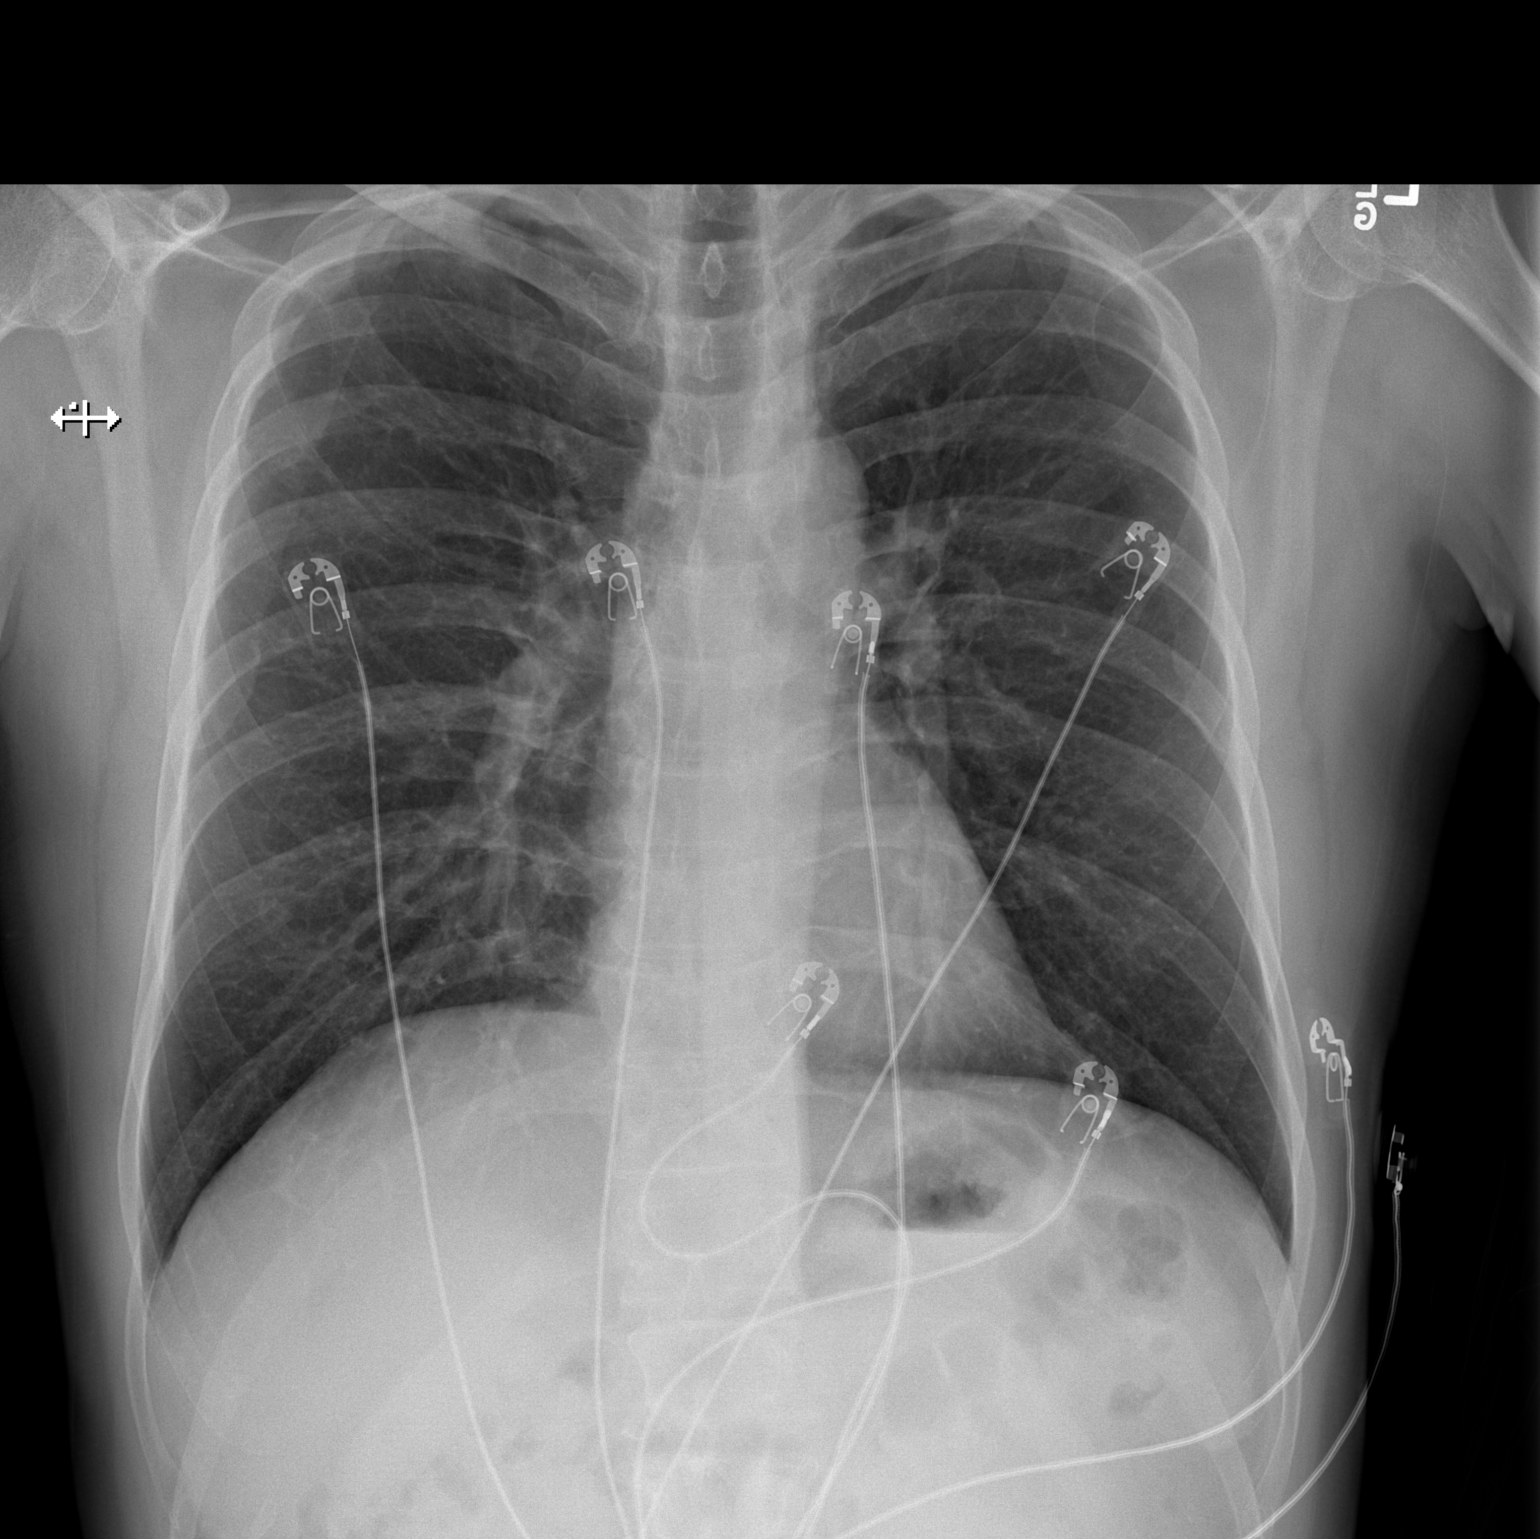

[w chest lat]
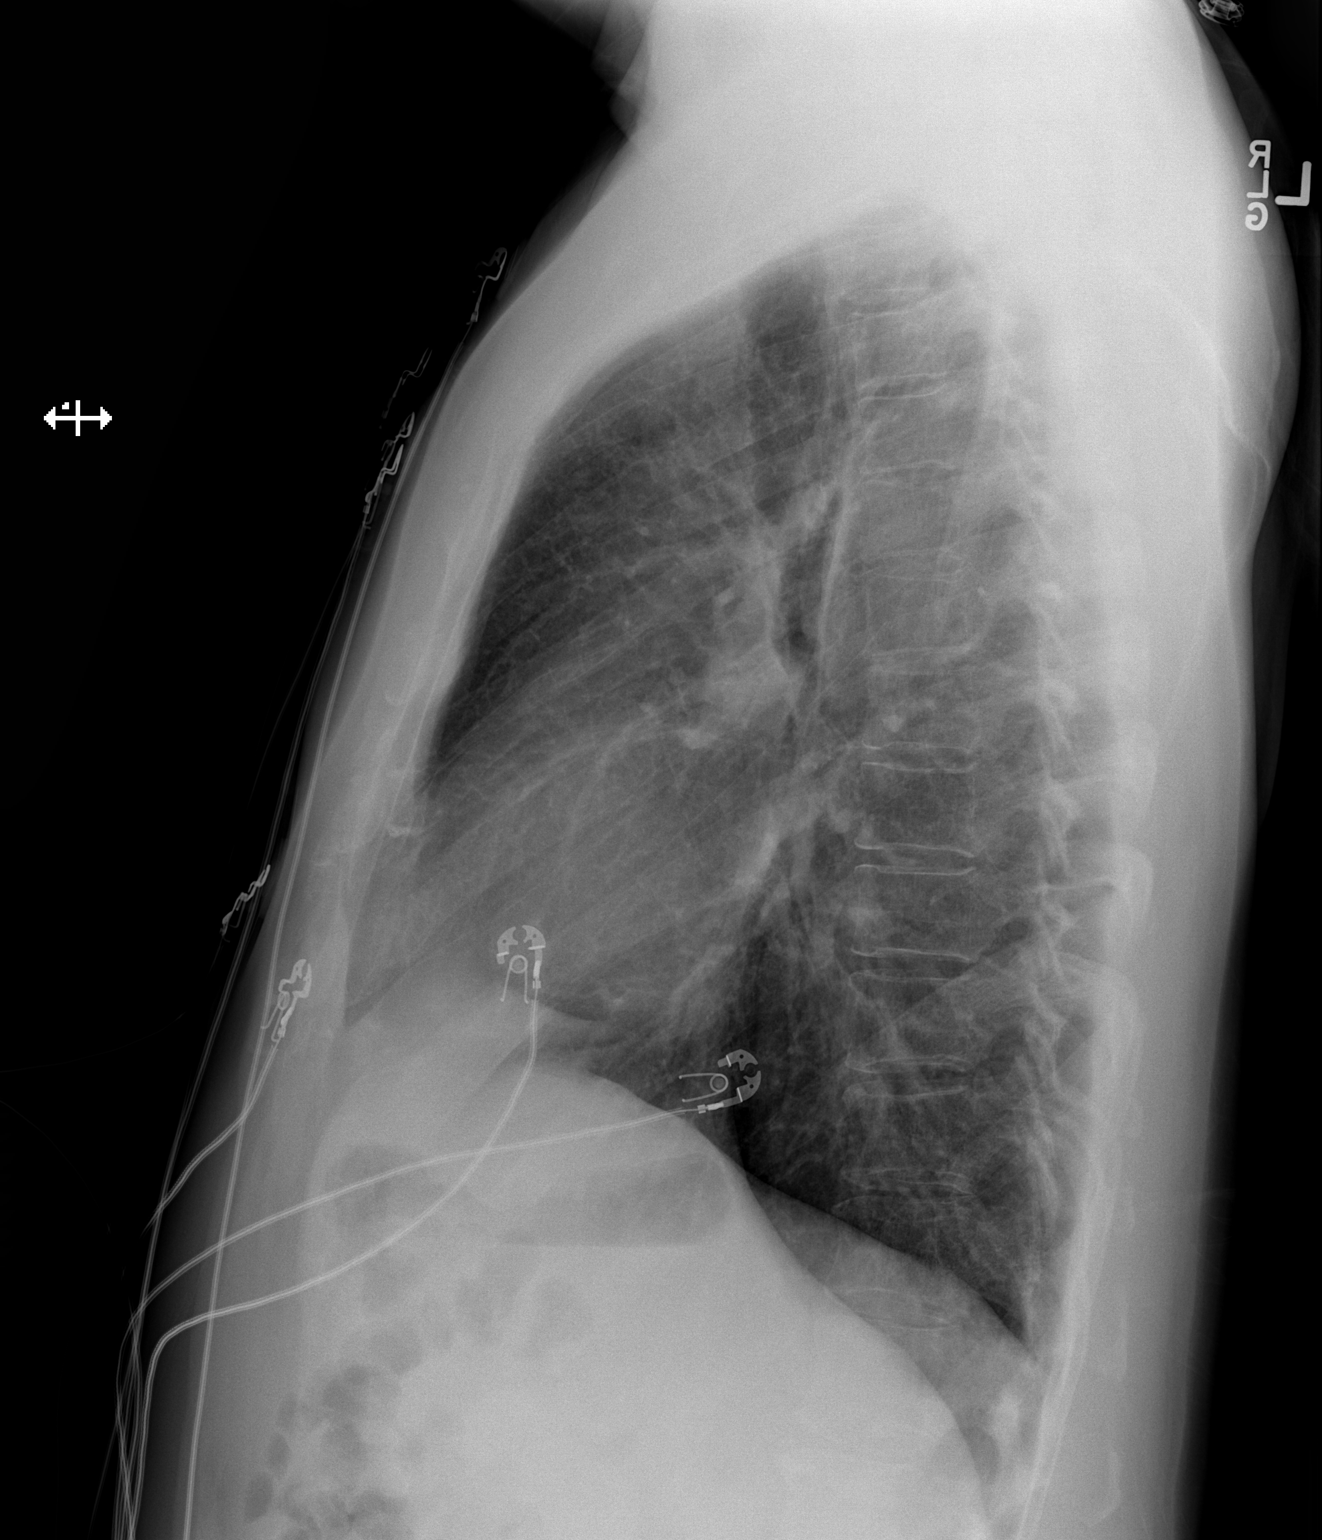

[2 of 2 positions shown; findings below may reference images not displayed]

FINDINGS: The heart size and mediastinal contours are within normal limits.
Both lungs are clear. The visualized skeletal structures are
unremarkable.
IMPRESSION: No active cardiopulmonary disease.

## 2018-03-20 ENCOUNTER — Telehealth: Payer: Self-pay | Admitting: Cardiovascular Disease

## 2018-03-20 NOTE — Telephone Encounter (Signed)
New message:      Patient c/o Palpitations:  High priority if patient c/o lightheadedness, shortness of breath, or chest pain  1) How long have you had palpitations/irregular HR/ Afib? Are you having the symptoms now? Few days now  2) Are you currently experiencing lightheadedness, SOB or CP? Sob when going up a flight of stairs  3) Do you have a history of afib (atrial fibrillation) or irregular heart rhythm?   4) Have you checked your BP or HR? (document readings if available): No  5) Are you experiencing any other symptoms? Pt states any time he sits down, crossed his legs, or leans forward his heart skips a beat

## 2018-03-20 NOTE — Telephone Encounter (Signed)
Returned call to patient. He reports pressure in chest and pounding in his heart when going up 3 flights of stairs. He also reports his heart skips a beat when he bends over, crosses his legs, props legs up, lifts arms up. He has these symptoms "all the time" for about 1 week. He states his metoprolol succinate 12.5mg  a while ago b/c it wasn't working for him. Advised that if he decides to stop a cardiac medication or something that our office prescribes does not work for him, it would be good to let us know so we can maybe change his meds. He has MD OV on 03/24/18. Will route to MD for review.

## 2018-03-24 ENCOUNTER — Ambulatory Visit: Payer: BLUE CROSS/BLUE SHIELD | Admitting: Cardiovascular Disease

## 2018-03-24 ENCOUNTER — Encounter: Payer: Self-pay | Admitting: Cardiovascular Disease

## 2018-03-24 DIAGNOSIS — R002 Palpitations: Secondary | ICD-10-CM | POA: Diagnosis not present

## 2018-03-24 NOTE — Assessment & Plan Note (Addendum)
History of palpitations with previous event monitors that showed frequent PACs.   he does complain of palpitations I have reassured him that these are benign.

## 2018-03-24 NOTE — Progress Notes (Signed)
03/24/2018 Barry Davis   05/01/1961  [id number]  Primary Physician Boneta Lucks, NP (Inactive) Primary Cardiologist: Runell Gess MD Nicholes Calamity, [place]  HPI:  Barry Davis is a 57 y.o.  thin-appearing divorced/widowed Caucasian male with no children , Who works as a Building services engineer. He Was referred by the emergency room because of palpitations and atypical chest pain. I last saw him in the office 02/26/2017.His primary care physician is Dr. Benedetto Goad. This was factors include 40-pack-years tobacco abuse having quit 8 months ago. Does smoke occasional pot. There no other cardiovascular risk factors. He did have hepatitis as a child. He has bipolar disorder and anxiety and has been on Seroquel which was recently discontinued this resulted in lack of sleep, increased anxiety and tachycardia palpitations as well as atypical chest pain. His major complaints are of palpitations.  He has had event monitors performed on 11/02/2015 and again on 01/23/2017 showed PACs.  I did begin him empirically on a beta-blocker which she decided not to take.  He does complain of palpitations which I reassured him are benign.  A 2D echo performed 2 years ago was completely normal.  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     No outpatient medications have been marked as taking for the 03/24/18 encounter (Office Visit) with Runell Gess, MD.     Allergies  Allergen Reactions  . Seroquel [Quetiapine] Palpitations  . Sulfa Antibiotics Anaphylaxis    Couldn't breathe    Social History   Socioeconomic History  . Marital status: Divorced    Spouse name: Not on file  . Number of children: Not on file  . Years of education: Not on file  . Highest education level: Not on file  Occupational History  . Not on file  Social Needs  . Financial resource strain: Not on file  . Food insecurity:    Worry: Not on file    Inability: Not on file  . Transportation needs:   Medical: Not on file    Non-medical: Not on file  Tobacco Use  . Smoking status: Former Games developer  . Smokeless tobacco: Never Used  Substance and Sexual Activity  . Alcohol use: No  . Drug use: No  . Sexual activity: Not on file  Lifestyle  . Physical activity:    Days per week: Not on file    Minutes per session: Not on file  . Stress: Not on file  Relationships  . Social connections:    Talks on phone: Not on file    Gets together: Not on file    Attends religious service: Not on file    Active member of club or organization: Not on file    Attends meetings of clubs or organizations: Not on file    Relationship status: Not on file  . Intimate partner violence:    Fear of current or ex partner: Not on file    Emotionally abused: Not on file    Physically abused: Not on file    Forced sexual activity: Not on file  Other Topics Concern  . Not on file  Social History Narrative  . Not on file     Review of Systems: General: negative for chills, fever, night sweats or weight changes.  Cardiovascular: negative for chest pain, dyspnea on exertion, edema, orthopnea, palpitations, paroxysmal nocturnal dyspnea or shortness of breath Dermatological: negative for rash Respiratory: negative for cough or wheezing Urologic: negative for hematuria Abdominal:  negative for nausea, vomiting, diarrhea, bright red blood per rectum, melena, or hematemesis Neurologic: negative for visual changes, syncope, or dizziness All other systems reviewed and are otherwise negative except as noted above.    Blood pressure 110/80, pulse (!) 55, height 5\' 11"  (1.803 m), weight 161 lb (73 kg).  General appearance: alert and no distress Neck: no adenopathy, no carotid bruit, no JVD, supple, symmetrical, trachea midline and thyroid not enlarged, symmetric, no tenderness/mass/nodules Lungs: clear to auscultation bilaterally Heart: regular rate and rhythm, S1, S2 normal, no murmur, click, rub or  gallop Extremities: extremities normal, atraumatic, no cyanosis or edema Pulses: 2+ and symmetric Skin: Skin color, texture, turgor normal. No rashes or lesions Neurologic: Alert and oriented X 3, normal strength and tone. Normal symmetric reflexes. Normal coordination and gait  EKG sinus bradycardia 55 without ST or T wave changes.  I personally reviewed this EKG.  ASSESSMENT AND PLAN:   Palpitations History of palpitations with previous event monitors that showed frequent PACs.   he does complain of palpitations I have reassured him that these are benign.      Runell GessJonathan J. Ayanah Snader MD FACP,FACC,FAHA, [facility] 03/24/2018 9:19 AM

## 2018-03-24 NOTE — Patient Instructions (Signed)
Your physician wants you to follow-up in: ONE YEAR WITH DR BERRY You will receive a reminder letter in the mail two months in advance. If you don't receive a letter, please call our office to schedule the follow-up appointment.   If you need a refill on your cardiac medications before your next appointment, please call your pharmacy.  

## 2018-03-31 ENCOUNTER — Ambulatory Visit: Payer: BLUE CROSS/BLUE SHIELD | Admitting: Cardiovascular Disease

## 2018-05-26 ENCOUNTER — Telehealth: Payer: Self-pay | Admitting: Cardiovascular Disease

## 2018-05-26 NOTE — Telephone Encounter (Signed)
New message    Patient calling to request "a medication" Dr Allyson SabalBerry mentioned, but patient declined at that time.  Patient states he feels the need for medication now that he is experiencing palpitations. He is unsure of the medication name

## 2018-05-27 ENCOUNTER — Telehealth: Payer: Self-pay | Admitting: Cardiovascular Disease

## 2018-05-27 NOTE — Telephone Encounter (Signed)
New Message:      Pt is calling again about a medication that dr. Allyson SabalBerry had suggested that he take for palps. The pt would like this medication now and sent to [id number] - [place],  - [address] [address] [address]

## 2018-05-27 NOTE — Telephone Encounter (Signed)
See telephone note 05/27/18. Encounter closed.

## 2018-05-27 NOTE — Telephone Encounter (Signed)
Spoke to patient. He states he used all the medication ( metoprolol succinate 12.5 mg)  and through the bottle away. Patient would like retry medication. Patient is aware will defer to Dr Allyson SabalBerry - - - whether to restart at same dose or increase.patient aware will call and let him know.

## 2018-05-28 MED ORDER — METOPROLOL SUCCINATE ER 25 MG PO TB24: 12.5000 mg | ORAL_TABLET | Freq: Every day | ORAL | 1 refills | Status: DC

## 2018-05-28 NOTE — Telephone Encounter (Signed)
Spoke with pt. Adv him of Dr.Berry's response.  May 28, 2018  Runell GessBerry, Jonathan J, MD      12:09 PM  Note    Restart his beta-blocker at same dose     Rx [facility] to [facility] pharmacy [facility] Succ[facility] 12.5mg  #45 R-1.  Pt verbalized understanding.

## 2018-05-28 NOTE — Telephone Encounter (Signed)
Restart his beta-blocker at same dose.

## 2018-06-06 ENCOUNTER — Emergency Department (HOSPITAL_COMMUNITY): Payer: BLUE CROSS/BLUE SHIELD

## 2018-06-06 ENCOUNTER — Encounter (HOSPITAL_COMMUNITY): Payer: Self-pay | Admitting: *Deleted

## 2018-06-06 ENCOUNTER — Other Ambulatory Visit: Payer: Self-pay

## 2018-06-06 ENCOUNTER — Emergency Department (HOSPITAL_COMMUNITY)
Admission: EM | Admit: 2018-06-06 | Discharge: 2018-06-06 | Disposition: A | Discharge status: Home / Self Care | Payer: BLUE CROSS/BLUE SHIELD | Attending: Emergency Medicine | Admitting: Emergency Medicine

## 2018-06-06 DIAGNOSIS — R634 Abnormal weight loss: Secondary | ICD-10-CM

## 2018-06-06 DIAGNOSIS — I10 Essential (primary) hypertension: Secondary | ICD-10-CM | POA: Diagnosis not present

## 2018-06-06 DIAGNOSIS — Z87891 Personal history of nicotine dependence: Secondary | ICD-10-CM | POA: Diagnosis not present

## 2018-06-06 DIAGNOSIS — R197 Diarrhea, unspecified: Secondary | ICD-10-CM | POA: Diagnosis not present

## 2018-06-06 DIAGNOSIS — Z79899 Other long term (current) drug therapy: Secondary | ICD-10-CM | POA: Insufficient documentation

## 2018-06-06 LAB — COMPREHENSIVE METABOLIC PANEL
ALK PHOS: 90 U/L (ref 38–126)
ALT: 24 U/L (ref 0–44)
AST: 23 U/L (ref 15–41)
Albumin: 4.6 g/dL (ref 3.5–5.0)
Anion gap: 10 (ref 5–15)
BUN: 12 mg/dL (ref 6–20)
CALCIUM: 9.6 mg/dL (ref 8.9–10.3)
CO2: 28 mmol/L (ref 22–32)
Chloride: 104 mmol/L (ref 98–111)
Creatinine, Ser: 0.86 mg/dL (ref 0.61–1.24)
GFR calc non Af Amer: >60 mL/min (ref >60)
Glucose, Bld: 116 mg/dL — ABNORMAL HIGH (ref 70–99)
Potassium: 3.9 mmol/L (ref 3.5–5.1)
SODIUM: 142 mmol/L (ref 135–145)
Total Bilirubin: 1 mg/dL (ref 0.3–1.2)
Total Protein: 8.3 g/dL — ABNORMAL HIGH (ref 6.5–8.1)

## 2018-06-06 LAB — URINALYSIS, ROUTINE W REFLEX MICROSCOPIC
BILIRUBIN URINE: NEGATIVE
Bacteria, UA: NONE SEEN
GLUCOSE, UA: NEGATIVE mg/dL
HGB URINE DIPSTICK: NEGATIVE
KETONES UR: NEGATIVE mg/dL
Leukocytes, UA: NEGATIVE
Nitrite: NEGATIVE
PH: 7 (ref 5.0–8.0)
Protein, ur: NEGATIVE mg/dL
SPECIFIC GRAVITY, URINE: 1.001 — AB (ref 1.005–1.030)

## 2018-06-06 LAB — CBC
HCT: 44.9 % (ref 39.0–52.0)
Hemoglobin: 15.9 g/dL (ref 13.0–17.0)
MCH: 30.9 pg (ref 26.0–34.0)
MCHC: 35.4 g/dL (ref 30.0–36.0)
MCV: 87.2 fL (ref 78.0–100.0)
PLATELETS: 295 10*3/uL (ref 150–400)
RBC: 5.15 MIL/uL (ref 4.22–5.81)
RDW: 12.3 % (ref 11.5–15.5)
WBC: 8.7 10*3/uL (ref 4.0–10.5)

## 2018-06-06 LAB — LIPASE, BLOOD: Lipase: 34 U/L (ref 11–51)

## 2018-06-06 LAB — TSH: TSH: 1.177 u[IU]/mL (ref 0.350–4.500)

## 2018-06-06 NOTE — ED Provider Notes (Signed)
[facility]-EMERGENCY DEPT Provider Note   CSN: [id number] Arrival date & time: 06/06/18  1103     History   Chief Complaint Chief Complaint  Patient presents with  . Hypertension  . Diarrhea  . Night Sweats    HPI Barry Davis is a 57 y.o. male.  The history is provided by the patient. No language interpreter was used.  Hypertension   Diarrhea       57 year old male with history of bipolar presenting to the ED for evaluation of multiple complaints.  Patient states for the past week he has had persistent night sweats, has lost approximately 10 pounds of weight, noticed that his heart is racing, feeling excessive thirst, increased urination, generalized weakness, and difficulty sleeping or having appetite.  The symptoms are new for him.  No specific treatment tried.  No report of fever, headache, chest pain, trouble breathing, trouble swallowing, abdominal cramping, dysuria or rash.  Denies any history of thyroid disease or any history of diabetes.  He was a former smoker but has quit for the past several years.  He does admits to using marijuana and states he vapes with marijuana for the past few months.  He denies feeling depressed or change in work environment.  He denies any recent travel.  No history of active cancer.  Endorsed macular degeneration and states his vision has been normal.  Furthermore he also discovered a lymph node to his left subclavian region that is nontender but states that his PCP is currently monitoring that.  Past Medical History:  Diagnosis Date  . Atypical chest pain   . Bipolar 1 disorder (HCC)   . Palpitations     Patient Active Problem List   Diagnosis Date Noted  . Anxiety 01/15/2017  . Palpitations 11/01/2015  . Atypical chest pain 11/01/2015    Past Surgical History:  Procedure Laterality Date  . APPENDECTOMY    . BACK SURGERY    . REPAIR OF FRACTURED PENIS          Home Medications    Prior to Admission  medications   Medication Sig Start Date End Date Taking? Authorizing Provider  metoprolol succinate (TOPROL XL) 25 MG 24 hr tablet Take 0.5 tablets (12.5 mg total) by mouth daily. 05/28/18   Runell Gess, MD    Family History Family History  Problem Relation Age of Onset  . Atrial fibrillation Father   . CVA Father   . Bladder Cancer Brother   . Renal cancer Brother     Social History Social History   Tobacco Use  . Smoking status: Former Games developer  . Smokeless tobacco: Never Used  Substance Use Topics  . Alcohol use: No  . Drug use: Yes    Types: Marijuana    Comment: Last use was 05/30/18     Allergies   Seroquel [quetiapine] and Sulfa antibiotics   Review of Systems Review of Systems  Gastrointestinal: Positive for diarrhea.  All other systems reviewed and are negative.    Physical Exam Updated Vital Signs BP (!) 146/85 (BP Location: Left Arm)   Pulse 73   Temp 98.1 F (36.7 C) (Oral)   Resp 18   Ht 5\' 10"  (1.778 m)   Wt 70.9 kg   SpO2 93%   BMI 22.44 kg/m   Physical Exam  Constitutional: He is oriented to person, place, and time. He appears well-developed and well-nourished. No distress.  Patient appears mildly anxious but nontoxic  HENT:  Head: Atraumatic.  Mouth/Throat: Oropharynx is clear and moist.  Eyes: Pupils are equal, round, and reactive to light. Conjunctivae and EOM are normal.  Neck: Normal range of motion. Neck supple. No thyromegaly present.  Small shotty left subclavian lymphadenopathy  Cardiovascular: Normal rate and regular rhythm.  Pulmonary/Chest: Effort normal and breath sounds normal. No stridor. No respiratory distress. He has no wheezes. He has no rales.  Abdominal: Soft. Bowel sounds are normal. He exhibits no distension. There is no tenderness.  Musculoskeletal: He exhibits no edema.  Lymphadenopathy:    He has no cervical adenopathy.  Neurological: He is alert and oriented to person, place, and time.  Skin: No rash  noted.  Psychiatric: He has a normal mood and affect.     ED Treatments / Results  Labs (all labs ordered are listed, but only abnormal results are displayed) Labs Reviewed  COMPREHENSIVE METABOLIC PANEL - Abnormal; Notable for the following components:      Result Value   Glucose, Bld 116 (*)    Total Protein 8.3 (*)    All other components within normal limits  URINALYSIS, ROUTINE W REFLEX MICROSCOPIC - Abnormal; Notable for the following components:   Color, Urine STRAW (*)    Specific Gravity, Urine 1.001 (*)    All other components within normal limits  LIPASE, BLOOD  CBC  TSH  HIV ANTIBODY (ROUTINE TESTING)  CBG MONITORING, ED    EKG None   Date: 06/06/2018  Rate: 81  Rhythm: normal sinus rhythm  QRS Axis: normal  Intervals: normal  ST/T Wave abnormalities: normal  Conduction Disutrbances: none  Narrative Interpretation:   Old EKG Reviewed: No significant changes noted     Radiology Dg Chest 2 View  Result Date: 06/06/2018 CLINICAL DATA:  Night sweats for 5 days. Weight loss of 10 lb in last week. EXAM: CHEST - 2 VIEW COMPARISON:  10/30/2015 FINDINGS: The heart size and mediastinal contours are within normal limits. Both lungs are clear. The visualized skeletal structures are unremarkable. IMPRESSION: No active cardiopulmonary disease. Electronically Signed   By: Myles Rosenthal M.D.   On: 06/06/2018 15:01    Procedures Procedures (including critical care time)  Medications Ordered in ED Medications - No data to display   Initial Impression / Assessment and Plan / ED Course  I have reviewed the triage vital signs and the nursing notes.  Pertinent labs & imaging results that were available during my care of the patient were reviewed by me and considered in my medical decision making (see chart for details).     BP (!) 146/85 (BP Location: Left Arm)   Pulse 73   Temp 98.1 F (36.7 C) (Oral)   Resp 18   Ht 5\' 10"  (1.778 m)   Wt 70.9 kg   SpO2 93%    BMI 22.44 kg/m    Final Clinical Impressions(s) / ED Diagnoses   Final diagnoses:  Diarrhea, unspecified type  Weight loss    ED Discharge Orders    None     1:29 PM Patient with history of anxiety here with concern of night sweats, weight loss as well as heart palpitation.  He also complaining of polyuria and polydipsia.  He is otherwise well-appearing, vital signs stable.  No findings to suggest thyrotoxicosis or thyroid storm.  He will benefit from TSH, CBG, basic labs, and EKG.  Screening chest x-ray ordered as patient noticed a left subclavian lymphopathy that concerns him.  3:11 PM Labs are reassuring, no significant electrolytes imbalance, fairly normal  CBG, normal H&H and normal WBC.  Normal lipase, normal TSH, and urine without signs of urinary tract infection.  Screening chest x-ray unremarkable. EKG unremarkable.   3:45 PM Patient disclosed to me that he is homosexual and he is concerning for potential HIV.  I will obtain HIV test and patient will be notified if he test positive.  Otherwise he is stable for discharge.  Encourage patient to follow-up with his provider for further management.   Fayrene Helper, PA-C 06/06/18 1616    Sabas Sous, MD 06/06/18 [phone]

## 2018-06-06 NOTE — ED Triage Notes (Signed)
Pt reports feeling sick with a high BP reading, loss of appetite, night sweats x 4-5 nights that soaks the bed, and weight loss of 10 lbs in the last week.  He feels like his heart is pounding which is atypical for the pt.  Pt states he is not sleeping well.  Pt also reports having diarrhea x 1 week. Pt a/o x 4 and ambulatory.

## 2018-06-06 NOTE — Discharge Instructions (Signed)
Please call and follow up closely with your doctor for further evaluation of your abnormal weight loss, night sweat and recurrent diarrhea.  An HIV test have been obtained, you will be notified if it is abnormal.

## 2018-06-06 NOTE — ED Notes (Signed)
Unable to collect patient labs at this time 

## 2018-06-07 LAB — HIV ANTIBODY (ROUTINE TESTING W REFLEX): HIV SCREEN 4TH GENERATION: NONREACTIVE

## 2018-09-02 ENCOUNTER — Other Ambulatory Visit: Payer: Self-pay | Admitting: Family Medicine

## 2018-09-02 DIAGNOSIS — R590 Localized enlarged lymph nodes: Secondary | ICD-10-CM

## 2018-09-09 ENCOUNTER — Other Ambulatory Visit: Payer: BLUE CROSS/BLUE SHIELD

## 2018-09-09 ENCOUNTER — Ambulatory Visit
Admission: RE | Admit: 2018-09-09 | Discharge: 2018-09-09 | Disposition: A | Discharge status: Home / Self Care | Payer: BLUE CROSS/BLUE SHIELD | Source: Ambulatory Visit | Attending: Family Medicine | Admitting: Family Medicine

## 2018-09-09 DIAGNOSIS — R590 Localized enlarged lymph nodes: Secondary | ICD-10-CM

## 2019-02-24 ENCOUNTER — Telehealth: Payer: Self-pay | Admitting: *Deleted

## 2019-02-24 NOTE — Telephone Encounter (Signed)
A message was left,with Mr.Putt (father) to have his son give [place] a call.

## 2019-06-17 ENCOUNTER — Telehealth: Payer: Self-pay | Admitting: Cardiovascular Disease

## 2019-06-17 NOTE — Telephone Encounter (Signed)
Called pt to schedule 1 yr followup, but, he is not sure if his insurance is good with our office.  He will call his insurance company first before scheduling.

## 2019-11-02 ENCOUNTER — Telehealth: Payer: Self-pay | Admitting: *Deleted

## 2019-11-02 NOTE — Telephone Encounter (Signed)
A message was left, re: his follow up visit. 

## 2019-11-05 ENCOUNTER — Encounter: Payer: Self-pay | Admitting: Cardiovascular Disease

## 2019-11-05 ENCOUNTER — Ambulatory Visit: Payer: 59 | Admitting: Cardiovascular Disease

## 2019-11-05 ENCOUNTER — Other Ambulatory Visit: Payer: Self-pay

## 2019-11-05 DIAGNOSIS — R002 Palpitations: Secondary | ICD-10-CM

## 2019-11-05 DIAGNOSIS — R0789 Other chest pain: Secondary | ICD-10-CM | POA: Diagnosis not present

## 2019-11-05 NOTE — Patient Instructions (Signed)
Medication Instructions:  Your physician recommends that you continue on your current medications as directed. Please refer to the Current Medication list given to you today.  If you need a refill on your cardiac medications before your next appointment, please call your pharmacy.   Lab work: NONE  Testing/Procedures: Your physician has requested that you have an echocardiogram. Echocardiography is a painless test that uses sound waves to create images of your heart. It provides your doctor with information about the size and shape of your heart and how well your heart's chambers and valves are working. This procedure takes approximately one hour. There are no restrictions for this procedure. [address]  Follow-Up: At [facility], you and your health needs are our priority.  As part of our continuing mission to provide you with exceptional heart care, we have created designated Provider Care Teams.  These Care Teams include your primary Cardiologist (physician) and Advanced Practice Providers (APPs -  Physician Assistants and Nurse Practitioners) who all work together to provide you with the care you need, when you need it. You may see Dr. Berry or one of the following Advanced Practice Providers on your designated Care Team:    Luke Kilroy, PA-C  Callie Goodrich, PA-C  Jesse Cleaver, FNP  Your physician wants you to follow-up as needed   

## 2019-11-05 NOTE — Assessment & Plan Note (Signed)
History of atypical chest pain with negative GXT 11/29/2015.

## 2019-11-05 NOTE — Progress Notes (Signed)
11/05/2019 Barry Davis   October 04, 1960  [id number]  Primary Physician Via, Lennette Bihari, MD Primary Cardiologist: Lorretta Harp MD Lupe Carney, [place]  HPI:  Barry Davis is a 59 y.o.    thin-appearing divorced/widowed Caucasian male with no children, who  worksas a florist. HeWasreferred by the emergency room because of palpitations and atypical chest pain.I last saw him in the office  03/24/2018 provider .His primary care physician is Dr. Harlene Ramus.  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  He does smoke occasional pot. There no other cardiovascular risk factors. He did have hepatitis as a child. He has bipolar disorder and anxiety and has been on Seroquel which was recently discontinued this resulted in lack of sleep, increased anxiety and tachycardia palpitations as well as atypical chest pain.  His major complaints are of palpitations.  He has had event monitors performed on 11/02/2015 and again on 01/23/2017 showed PACs.  I did begin him empirically on a beta-blocker which she decided not to take.  He does complain of palpitations which I reassured him are benign.  A 2D echo performed 3 years ago was completely normal.  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    Current Meds  Medication Sig  . [DISCONTINUED] metoprolol succinate (TOPROL XL) 25 MG 24 hr tablet Take 0.5 tablets (12.5 mg total) by mouth daily.     Allergies  Allergen Reactions  . Seroquel [Quetiapine] Palpitations  . Sulfa Antibiotics Anaphylaxis    Couldn't breathe    Social History   Socioeconomic History  . Marital status: Divorced    Spouse name: Not on file  . Number of children: Not on file  . Years of education: Not on file  . Highest education level: Not on file  Occupational History  . Not on file  Tobacco Use  . Smoking status: Former Research scientist (life sciences)  . Smokeless tobacco: Never Used  Substance and Sexual  Activity  . Alcohol use: No  . Drug use: Yes    Types: Marijuana    Comment: Last use was 05/30/18  . Sexual activity: Not on file  Other Topics Concern  . Not on file  Social History Narrative  . Not on file   Social Determinants of Health   Financial Resource Strain:   . Difficulty of Paying Living Expenses: Not on file  Food Insecurity:   . Worried About Charity fundraiser in the Last Year: Not on file  . Ran Out of Food in the Last Year: Not on file  Transportation Needs:   . Lack of Transportation (Medical): Not on file  . Lack of Transportation (Non-Medical): Not on file  Physical Activity:   . Days of Exercise per Week: Not on file  . Minutes of Exercise per Session: Not on file  Stress:   . Feeling of Stress : Not on file  Social Connections:   . Frequency of Communication with Friends and Family: Not on file  . Frequency of Social Gatherings with Friends and Family: Not on file  . Attends Religious Services: Not on file  . Active Member of Clubs or Organizations: Not on file  . Attends Archivist Meetings: Not on file  . Marital Status: Not on file  Intimate Partner Violence:   . Fear of Current or Ex-Partner: Not on file  . Emotionally Abused: Not on file  . Physically Abused: Not on file  . Sexually  Abused: Not on file     Review of Systems: General: negative for chills, fever, night sweats or weight changes.  Cardiovascular: negative for chest pain, dyspnea on exertion, edema, orthopnea, palpitations, paroxysmal nocturnal dyspnea or shortness of breath Dermatological: negative for rash Respiratory: negative for cough or wheezing Urologic: negative for hematuria Abdominal: negative for nausea, vomiting, diarrhea, bright red blood per rectum, melena, or hematemesis Neurologic: negative for visual changes, syncope, or dizziness All other systems reviewed and are otherwise negative except as noted above.    Blood pressure 118/74, pulse (!) 57,  temperature 97.9 F (36.6 C), height 5\' 10"  (1.778 m), weight 170 lb 12.8 oz (77.5 kg), SpO2 99 %.  General appearance: alert and no distress Neck: no adenopathy, no carotid bruit, no JVD, supple, symmetrical, trachea midline and thyroid not enlarged, symmetric, no tenderness/mass/nodules Lungs: clear to auscultation bilaterally Heart: regular rate and rhythm, S1, S2 normal, no murmur, click, rub or gallop Extremities: extremities normal, atraumatic, no cyanosis or edema Pulses: 2+ and symmetric Skin: Skin color, texture, turgor normal. No rashes or lesions Neurologic: Alert and oriented X 3, normal strength and tone. Normal symmetric reflexes. Normal coordination and gait  EKG sinus bradycardia 55 without ST or T wave changes.  I personally reviewed this EKG.  ASSESSMENT AND PLAN:   Palpitations History of palpitations most likely PACs by event monitoring back in 2017.  I have reassured him of the benign nature of these.  Atypical chest pain History of atypical chest pain with negative GXT 11/29/2015.      01/29/2016 MD FACP,FACC,FAHA, [facility] 11/05/2019 4:33 PM

## 2019-11-05 NOTE — Assessment & Plan Note (Signed)
History of palpitations most likely PACs by event monitoring back in 2017.  I have reassured him of the benign nature of these.

## 2019-11-17 ENCOUNTER — Telehealth: Payer: Self-pay | Admitting: Cardiovascular Disease

## 2019-11-17 NOTE — Telephone Encounter (Signed)
Patient is calling requesting prior authorization for an Echo is resubmitted. He states the first one was denied due to insurance stating it was unnecessary. Please advise.

## 2019-11-17 NOTE — Telephone Encounter (Signed)
Routed to pre-cert

## 2019-11-19 ENCOUNTER — Other Ambulatory Visit (HOSPITAL_COMMUNITY): Payer: 59

## 2019-12-13 ENCOUNTER — Other Ambulatory Visit (HOSPITAL_COMMUNITY)
Admission: RE | Admit: 2019-12-13 | Discharge: 2019-12-13 | Disposition: A | Discharge status: Home / Self Care | Payer: 59 | Source: Ambulatory Visit | Attending: General Surgery | Admitting: General Surgery

## 2019-12-13 DIAGNOSIS — Z20822 Contact with and (suspected) exposure to covid-19: Secondary | ICD-10-CM | POA: Insufficient documentation

## 2019-12-13 DIAGNOSIS — Z01812 Encounter for preprocedural laboratory examination: Secondary | ICD-10-CM | POA: Diagnosis present

## 2019-12-13 LAB — SARS CORONAVIRUS 2 (TAT 6-24 HRS): SARS Coronavirus 2: NEGATIVE

## 2019-12-14 ENCOUNTER — Other Ambulatory Visit: Payer: Self-pay

## 2019-12-14 ENCOUNTER — Encounter (HOSPITAL_COMMUNITY): Payer: Self-pay | Admitting: General Surgery

## 2019-12-14 ENCOUNTER — Ambulatory Visit: Payer: Self-pay | Admitting: General Surgery

## 2019-12-14 NOTE — Progress Notes (Signed)
Spoke with pt for pre-op call. Pt sees Dr. Allyson Sabal for palpitations. Denies any other heart hx. Pt states he is not diabetic and can have low blood pressure, never diagnosed with HTN.   Covid test done 12/13/19 and it's negative. Pt states he's been in quarantine since the test was done and will continue to be in it until he comes to the hospital.  Pt states he is arriving via [place] and going home via [place]. I did inform him that he would still need a responsible adult with him because the [place] driver can't be responsible for him. He voiced understanding.   Pt also wanted to be aware that he a strong phobia about needles. He states he has passed out during a procedure with a needle.

## 2019-12-15 ENCOUNTER — Encounter (HOSPITAL_COMMUNITY): Payer: Self-pay | Admitting: General Surgery

## 2019-12-15 ENCOUNTER — Other Ambulatory Visit: Payer: Self-pay

## 2019-12-15 ENCOUNTER — Ambulatory Visit (HOSPITAL_COMMUNITY): Payer: 59 | Admitting: Certified Registered Nurse Anesthetist

## 2019-12-15 ENCOUNTER — Ambulatory Visit (HOSPITAL_COMMUNITY)
Admission: RE | Admit: 2019-12-15 | Discharge: 2019-12-15 | Disposition: A | Discharge status: Home / Self Care | Payer: 59 | Attending: General Surgery | Admitting: General Surgery

## 2019-12-15 ENCOUNTER — Encounter (HOSPITAL_COMMUNITY)
Admission: RE | Disposition: A | Discharge status: Home / Self Care | Payer: Self-pay | Source: Home / Self Care | Attending: General Surgery

## 2019-12-15 ENCOUNTER — Ambulatory Visit: Payer: Self-pay

## 2019-12-15 DIAGNOSIS — J449 Chronic obstructive pulmonary disease, unspecified: Secondary | ICD-10-CM | POA: Insufficient documentation

## 2019-12-15 DIAGNOSIS — Z87891 Personal history of nicotine dependence: Secondary | ICD-10-CM | POA: Insufficient documentation

## 2019-12-15 DIAGNOSIS — Z882 Allergy status to sulfonamides status: Secondary | ICD-10-CM | POA: Insufficient documentation

## 2019-12-15 DIAGNOSIS — K4020 Bilateral inguinal hernia, without obstruction or gangrene, not specified as recurrent: Secondary | ICD-10-CM | POA: Diagnosis present

## 2019-12-15 DIAGNOSIS — F319 Bipolar disorder, unspecified: Secondary | ICD-10-CM | POA: Insufficient documentation

## 2019-12-15 DIAGNOSIS — M1990 Unspecified osteoarthritis, unspecified site: Secondary | ICD-10-CM | POA: Diagnosis not present

## 2019-12-15 HISTORY — PX: INSERTION OF MESH: SHX5868

## 2019-12-15 HISTORY — DX: Gastro-esophageal reflux disease without esophagitis: K21.9

## 2019-12-15 HISTORY — PX: INGUINAL HERNIA REPAIR: SHX194

## 2019-12-15 HISTORY — DX: Unspecified osteoarthritis, unspecified site: M19.90

## 2019-12-15 HISTORY — DX: Inflammatory liver disease, unspecified: K75.9

## 2019-12-15 SURGERY — REPAIR, HERNIA, INGUINAL, BILATERAL, LAPAROSCOPIC
Anesthesia: General | Site: Inguinal | Laterality: Bilateral

## 2019-12-15 MED ORDER — SODIUM CHLORIDE 0.9 % IR SOLN
Status: DC | PRN
  Administered 2019-12-15: 1000 mL

## 2019-12-15 MED ORDER — OXYCODONE HCL 5 MG PO TABS
ORAL_TABLET | ORAL | Status: AC
  Filled 2019-12-15: qty 1

## 2019-12-15 MED ORDER — CHLORHEXIDINE GLUCONATE CLOTH 2 % EX PADS: 6.0000 | MEDICATED_PAD | Freq: Once | CUTANEOUS | Status: DC

## 2019-12-15 MED ORDER — MIDAZOLAM HCL 2 MG/2ML IJ SOLN
INTRAMUSCULAR | Status: AC
  Filled 2019-12-15: qty 2

## 2019-12-15 MED ORDER — GABAPENTIN 300 MG PO CAPS
ORAL_CAPSULE | ORAL | Status: AC
  Administered 2019-12-15: 300 mg via ORAL
  Filled 2019-12-15: qty 1

## 2019-12-15 MED ORDER — OXYCODONE HCL 5 MG PO TABS
5.0000 mg | ORAL_TABLET | Freq: Once | ORAL | Status: AC | PRN
  Administered 2019-12-15: 5 mg via ORAL

## 2019-12-15 MED ORDER — GABAPENTIN 300 MG PO CAPS: 300.0000 mg | ORAL_CAPSULE | ORAL | Status: AC

## 2019-12-15 MED ORDER — SUCCINYLCHOLINE CHLORIDE 200 MG/10ML IV SOSY
PREFILLED_SYRINGE | INTRAVENOUS | Status: AC
  Filled 2019-12-15: qty 30

## 2019-12-15 MED ORDER — LIDOCAINE 2% (20 MG/ML) 5 ML SYRINGE
INTRAMUSCULAR | Status: DC | PRN
  Administered 2019-12-15: 100 mg via INTRAVENOUS

## 2019-12-15 MED ORDER — LIDOCAINE 2% (20 MG/ML) 5 ML SYRINGE
INTRAMUSCULAR | Status: AC
  Filled 2019-12-15: qty 10

## 2019-12-15 MED ORDER — PROMETHAZINE HCL 25 MG/ML IJ SOLN: 6.2500 mg | INTRAMUSCULAR | Status: DC | PRN

## 2019-12-15 MED ORDER — ONDANSETRON HCL 4 MG/2ML IJ SOLN
INTRAMUSCULAR | Status: AC
  Filled 2019-12-15: qty 4

## 2019-12-15 MED ORDER — BUPIVACAINE HCL (PF) 0.25 % IJ SOLN
INTRAMUSCULAR | Status: DC | PRN
  Administered 2019-12-15: 7 mL

## 2019-12-15 MED ORDER — OXYCODONE HCL 5 MG/5ML PO SOLN: 5.0000 mg | Freq: Once | ORAL | Status: AC | PRN

## 2019-12-15 MED ORDER — ACETAMINOPHEN 500 MG PO TABS: 1000.0000 mg | ORAL_TABLET | ORAL | Status: AC

## 2019-12-15 MED ORDER — MIDAZOLAM HCL 2 MG/2ML IJ SOLN
INTRAMUSCULAR | Status: DC | PRN
  Administered 2019-12-15: 2 mg via INTRAVENOUS

## 2019-12-15 MED ORDER — ONDANSETRON HCL 4 MG/2ML IJ SOLN
INTRAMUSCULAR | Status: DC | PRN
  Administered 2019-12-15: 4 mg via INTRAVENOUS

## 2019-12-15 MED ORDER — DEXAMETHASONE SODIUM PHOSPHATE 10 MG/ML IJ SOLN
INTRAMUSCULAR | Status: DC | PRN
  Administered 2019-12-15: 5 mg via INTRAVENOUS

## 2019-12-15 MED ORDER — CEFAZOLIN SODIUM-DEXTROSE 2-4 GM/100ML-% IV SOLN
2.0000 g | INTRAVENOUS | Status: AC
  Administered 2019-12-15: 2 g via INTRAVENOUS

## 2019-12-15 MED ORDER — FENTANYL CITRATE (PF) 100 MCG/2ML IJ SOLN
25.0000 ug | INTRAMUSCULAR | Status: DC | PRN
  Administered 2019-12-15: 50 ug via INTRAVENOUS

## 2019-12-15 MED ORDER — 0.9 % SODIUM CHLORIDE (POUR BTL) OPTIME
TOPICAL | Status: DC | PRN
  Administered 2019-12-15: 1000 mL

## 2019-12-15 MED ORDER — TRAMADOL HCL 50 MG PO TABS: 50.0000 mg | ORAL_TABLET | Freq: Four times a day (QID) | ORAL | 0 refills | Status: DC | PRN

## 2019-12-15 MED ORDER — FENTANYL CITRATE (PF) 250 MCG/5ML IJ SOLN
INTRAMUSCULAR | Status: AC
  Filled 2019-12-15: qty 5

## 2019-12-15 MED ORDER — ACETAMINOPHEN 500 MG PO TABS
ORAL_TABLET | ORAL | Status: AC
  Administered 2019-12-15: 1000 mg via ORAL
  Filled 2019-12-15: qty 2

## 2019-12-15 MED ORDER — CEFAZOLIN SODIUM-DEXTROSE 2-4 GM/100ML-% IV SOLN
INTRAVENOUS | Status: AC
  Filled 2019-12-15: qty 100

## 2019-12-15 MED ORDER — ROCURONIUM BROMIDE 10 MG/ML (PF) SYRINGE
PREFILLED_SYRINGE | INTRAVENOUS | Status: AC
  Filled 2019-12-15: qty 20

## 2019-12-15 MED ORDER — DEXAMETHASONE SODIUM PHOSPHATE 10 MG/ML IJ SOLN
INTRAMUSCULAR | Status: AC
  Filled 2019-12-15: qty 2

## 2019-12-15 MED ORDER — DEXMEDETOMIDINE HCL IN NACL 200 MCG/50ML IV SOLN
INTRAVENOUS | Status: DC | PRN
  Administered 2019-12-15: 40 ug via INTRAVENOUS

## 2019-12-15 MED ORDER — ROCURONIUM BROMIDE 10 MG/ML (PF) SYRINGE
PREFILLED_SYRINGE | INTRAVENOUS | Status: DC | PRN
  Administered 2019-12-15: 50 mg via INTRAVENOUS

## 2019-12-15 MED ORDER — LACTATED RINGERS IV SOLN: INTRAVENOUS | Status: DC

## 2019-12-15 MED ORDER — BUPIVACAINE HCL (PF) 0.25 % IJ SOLN
INTRAMUSCULAR | Status: AC
  Filled 2019-12-15: qty 30

## 2019-12-15 MED ORDER — PROPOFOL 10 MG/ML IV BOLUS
INTRAVENOUS | Status: DC | PRN
  Administered 2019-12-15: 200 mg via INTRAVENOUS

## 2019-12-15 MED ORDER — FENTANYL CITRATE (PF) 100 MCG/2ML IJ SOLN
INTRAMUSCULAR | Status: AC
  Filled 2019-12-15: qty 2

## 2019-12-15 MED ORDER — FENTANYL CITRATE (PF) 250 MCG/5ML IJ SOLN
INTRAMUSCULAR | Status: DC | PRN
  Administered 2019-12-15: 50 ug via INTRAVENOUS
  Administered 2019-12-15: 150 ug via INTRAVENOUS
  Administered 2019-12-15: 50 ug via INTRAVENOUS

## 2019-12-15 SURGICAL SUPPLY — 47 items
APPLIER CLIP LOGIC TI 5 (MISCELLANEOUS) IMPLANT
CANISTER SUCT 3000ML PPV (MISCELLANEOUS) ×1 IMPLANT
CHLORAPREP W/TINT 26 (MISCELLANEOUS) ×2 IMPLANT
COVER SURGICAL LIGHT HANDLE (MISCELLANEOUS) ×2 IMPLANT
COVER WAND RF STERILE (DRAPES) ×2 IMPLANT
DERMABOND ADVANCED (GAUZE/BANDAGES/DRESSINGS) ×1
DERMABOND ADVANCED .7 DNX12 (GAUZE/BANDAGES/DRESSINGS) ×1 IMPLANT
DISSECTOR BLUNT TIP ENDO 5MM (MISCELLANEOUS) IMPLANT
DRAPE LAPAROSCOPIC ABDOMINAL (DRAPES) ×2 IMPLANT
ELECT REM PT RETURN 9FT ADLT (ELECTROSURGICAL) ×2
ELECTRODE REM PT RTRN 9FT ADLT (ELECTROSURGICAL) ×1 IMPLANT
GLOVE BIO SURGEON STRL SZ7.5 (GLOVE) ×2 IMPLANT
GOWN STRL REUS W/ TWL LRG LVL3 (GOWN DISPOSABLE) ×2 IMPLANT
GOWN STRL REUS W/ TWL XL LVL3 (GOWN DISPOSABLE) ×1 IMPLANT
GOWN STRL REUS W/TWL LRG LVL3 (GOWN DISPOSABLE) ×2
GOWN STRL REUS W/TWL XL LVL3 (GOWN DISPOSABLE) ×1
GRASPER SUT TROCAR 14GX15 (MISCELLANEOUS) ×1 IMPLANT
KIT BASIN OR (CUSTOM PROCEDURE TRAY) ×2 IMPLANT
KIT TURNOVER KIT B (KITS) ×2 IMPLANT
MESH 3DMAX 5X7 LT XLRG (Mesh General) ×1 IMPLANT
MESH 3DMAX 5X7 RT XLRG (Mesh General) ×1 IMPLANT
NDL INSUFFLATION 14GA 120MM (NEEDLE) IMPLANT
NEEDLE INSUFFLATION 14GA 120MM (NEEDLE) IMPLANT
NS IRRIG 1000ML POUR BTL (IV SOLUTION) ×2 IMPLANT
PAD ARMBOARD 7.5X6 YLW CONV (MISCELLANEOUS) ×4 IMPLANT
RELOAD STAPLE 4.0 BLU F/HERNIA (INSTRUMENTS) ×1 IMPLANT
RELOAD STAPLE 4.8 BLK F/HERNIA (STAPLE) IMPLANT
RELOAD STAPLE HERNIA 4.0 BLUE (INSTRUMENTS) ×2 IMPLANT
RELOAD STAPLE HERNIA 4.8 BLK (STAPLE) IMPLANT
SCISSORS LAP 5X35 DISP (ENDOMECHANICALS) ×2 IMPLANT
SET IRRIG TUBING LAPAROSCOPIC (IRRIGATION / IRRIGATOR) IMPLANT
SET TUBE SMOKE EVAC HIGH FLOW (TUBING) ×2 IMPLANT
STAPLER HERNIA 12 8.5 360D (INSTRUMENTS) ×2 IMPLANT
SUT MNCRL AB 4-0 PS2 18 (SUTURE) ×2 IMPLANT
SUT VIC AB 1 CT1 27 (SUTURE)
SUT VIC AB 1 CT1 27XBRD ANBCTR (SUTURE) IMPLANT
SUT VICRYL 0 UR6 27IN ABS (SUTURE) ×1 IMPLANT
TOWEL GREEN STERILE (TOWEL DISPOSABLE) ×2 IMPLANT
TOWEL GREEN STERILE FF (TOWEL DISPOSABLE) ×2 IMPLANT
TRAY FOLEY W/BAG SLVR 16FR (SET/KITS/TRAYS/PACK) ×1
TRAY FOLEY W/BAG SLVR 16FR ST (SET/KITS/TRAYS/PACK) ×1 IMPLANT
TRAY LAPAROSCOPIC MC (CUSTOM PROCEDURE TRAY) ×2 IMPLANT
TROCAR OPTICAL SHORT 5MM (TROCAR) ×2 IMPLANT
TROCAR OPTICAL SLV SHORT 5MM (TROCAR) ×2 IMPLANT
TROCAR XCEL 12X100 BLDLESS (ENDOMECHANICALS) ×2 IMPLANT
WARMER LAPAROSCOPE (MISCELLANEOUS) ×1 IMPLANT
WATER STERILE IRR 1000ML POUR (IV SOLUTION) ×2 IMPLANT

## 2019-12-15 NOTE — Transfer of Care (Signed)
Immediate Anesthesia Transfer of Care Note  Patient: Barry Davis  Procedure(s) Performed: LAPAROSCOPIC BILATERAL INGUINAL HERNIA REPAIR WITH MESH (Bilateral Inguinal) Insertion Of Mesh (Bilateral Inguinal)  Patient Location: PACU  Anesthesia Type:General  Level of Consciousness: drowsy and patient cooperative  Airway & Oxygen Therapy: Patient Spontanous Breathing  Post-op Assessment: Report given to RN and Post -op Vital signs reviewed and stable  Post vital signs: Reviewed and stable  Last Vitals:  Vitals Value Taken Time  BP 129/80 12/15/19 1209  Temp    Pulse 58 12/15/19 1211  Resp 14 12/15/19 1211  SpO2 94 % 12/15/19 1211  Vitals shown include unvalidated device data.  Last Pain:  Vitals:   12/15/19 0841  PainSc: 0-No pain         Complications: No apparent anesthesia complications

## 2019-12-15 NOTE — Telephone Encounter (Signed)
Incoming call from Patient  With complaint of urinating when urinating.  Patient states that he recently had a double hernia  Repair.  IN serious Pain Patient was upset.  Recommended Pt.  go to ED or urgent care.

## 2019-12-15 NOTE — Discharge Instructions (Signed)
[facility] _______[facility] °INGUINAL HERNIA REPAIR: POST OP INSTRUCTIONS ° °Always review your discharge instruction sheet given to you by the facility where your surgery was performed. °IF YOU HAVE DISABILITY OR FAMILY LEAVE FORMS, YOU MUST BRING THEM TO THE OFFICE FOR PROCESSING.   °DO NOT GIVE THEM TO YOUR DOCTOR. ° °1. A  prescription for pain medication may be given to you upon discharge.  Take your pain medication as prescribed, if needed.  If narcotic pain medicine is not needed, then you may take acetaminophen (Tylenol) or ibuprofen (Advil) as needed. °2. Take your usually prescribed medications unless otherwise directed. °If you need a refill on your pain medication, please contact your pharmacy.  They will contact our office to request authorization. Prescriptions will not be filled after 5 pm or on week-ends. °3. You should follow a light diet the first 24 hours after arrival home, such as soup and crackers, etc.  Be sure to include lots of fluids daily.  Resume your normal diet the day after surgery. °4.Most patients will experience some swelling and bruising around the umbilicus or in the groin and scrotum.  Ice packs and reclining will help.  Swelling and bruising can take several days to resolve.  °6. It is common to experience some constipation if taking pain medication after surgery.  Increasing fluid intake and taking a stool softener (such as Colace) will usually help or prevent this problem from occurring.  A mild laxative (Milk of Magnesia or Miralax) should be taken according to package directions if there are no bowel movements after 48 hours. °7. Unless discharge instructions indicate otherwise, you may remove your bandages 24-48 hours after surgery, and you may shower at that time.  You may have steri-strips (small skin tapes) in place directly over the incision.  These strips should be left on the skin for 7-10 days.  If your surgeon used skin glue on the incision, you may  shower in 24 hours.  The glue will flake off over the next 2-3 weeks.  Any sutures or staples will be removed at the office during your follow-up visit. °8. ACTIVITIES:  You may resume regular (light) daily activities beginning the next day--such as daily self-care, walking, climbing stairs--gradually increasing activities as tolerated.  You may have sexual intercourse when it is comfortable.  Refrain from any heavy lifting or straining until approved by your doctor. ° °a.You may drive when you are no longer taking prescription pain medication, you can comfortably wear a seatbelt, and you can safely maneuver your car and apply brakes. °b.RETURN TO WORK:   °_____________________________________________ ° °9.You should see your doctor in the office for a follow-up appointment approximately 2-3 weeks after your surgery.  Make sure that you call for this appointment within a day or two after you arrive home to insure a convenient appointment time. °10.OTHER INSTRUCTIONS: _________________________ °   _____________________________________ ° °WHEN TO CALL YOUR DOCTOR: °1. Fever over 101.0 °2. Inability to urinate °3. Nausea and/or vomiting °4. Extreme swelling or bruising °5. Continued bleeding from incision. °6. Increased pain, redness, or drainage from the incision ° °The clinic staff is available to answer your questions during regular business hours.  Please don’t hesitate to call and ask to speak to one of the nurses for clinical concerns.  If you have a medical emergency, go to the nearest emergency room or call 911.  A surgeon from [facility] is always on call at the hospital ° ° °[address], [address], [place], [place]  [address] ? ° [address] [address], [place],    [address] °[phone] ? [phone] ? FAX [phone] °Web site: [email or web address] ° °

## 2019-12-15 NOTE — Anesthesia Procedure Notes (Signed)
Procedure Name: Intubation Date/Time: 12/15/2019 11:00 AM Performed by: Janace Litten, CRNA Pre-anesthesia Checklist: Patient identified, Emergency Drugs available, Suction available and Patient being monitored Patient Re-evaluated:Patient Re-evaluated prior to induction Oxygen Delivery Method: Circle System Utilized Preoxygenation: Pre-oxygenation with 100% oxygen Induction Type: IV induction Ventilation: Mask ventilation without difficulty Laryngoscope Size: Mac and 4 Grade View: Grade II Tube type: Oral Tube size: 7.5 mm Number of attempts: 1 Airway Equipment and Method: Stylet Placement Confirmation: ETT inserted through vocal cords under direct vision,  positive ETCO2 and breath sounds checked- equal and bilateral Secured at: 22 cm Tube secured with: Tape Dental Injury: Teeth and Oropharynx as per pre-operative assessment

## 2019-12-15 NOTE — H&P (Signed)
  The patient is a 59 year old male who presents with an inguinal hernia. Referred by: Dr. Marlou Porch Chief Complaint: Bilateral hernias  Patient is a 59 year old male comes in with a previous history of PVCs, and a four-month history of bilateral hernias. He states that he noticed the hernias while in the shower, he a small bulge to the left side and then the right side. He states that he's had no significant pain to his hernias. He does state that he is fairly active and does do significant walking. Patient had no signs or symptoms of incarceration or granulation. Patient has had a previous open appendectomy in the past. Patient has no imaging studies.    Past Surgical History Oral Surgery  Tonsillectomy   Diagnostic Studies History Colonoscopy  1-5 years ago  Allergies Sulfa Drugs  Allergies Reconciled   Medication History No Current Medications Medications Reconciled  Social History  Illicit drug use  Uses daily. No alcohol use  No caffeine use  Tobacco use  Former smoker.  Family History  Cancer  Brother, Family Members In General.  Other Problems  Anxiety Disorder  Depression  Inguinal Hernia     Review of Systems HEENT Present- Wears glasses/contact lenses. Not Present- Earache, Hearing Loss, Hoarseness, Nose Bleed, Oral Ulcers, Ringing in the Ears, Seasonal Allergies, Sinus Pain, Sore Throat, Visual Disturbances and Yellow Eyes. Cardiovascular Present- Chest Pain and Palpitations. Not Present- Difficulty Breathing Lying Down, Leg Cramps, Rapid Heart Rate, Shortness of Breath and Swelling of Extremities. Male Genitourinary Present- Change in Urinary Stream, Frequency and Nocturia. Not Present- Blood in Urine, Impotence, Painful Urination, Urgency and Urine Leakage. Psychiatric Present- Change in Sleep Pattern. Not Present- Anxiety, Bipolar, Depression, Fearful and Frequent crying. All other systems negative BP (!) 124/97   Pulse (!) 55   Temp  97.8 F (36.6 C)   Resp 18   Ht 5\' 11"  (1.803 m)   Wt 77.6 kg   SpO2 98%   BMI 23.85 kg/m    Physical Exam  The physical exam findings are as follows: Note:Constitutional: No acute distress, conversant, appears stated age  Eyes: Anicteric sclerae, moist conjunctiva, no lid lag  Neck: No thyromegaly, trachea midline, no cervical lymphadenopathy  Lungs: Clear to auscultation biilaterally, normal respiratory effot  Cardiovascular: regular rate & rhythm, no murmurs, no peripheal edema, pedal pulses 2+  GI: Soft, no masses or hepatosplenomegaly, non-tender to palpation  MSK: Normal gait, no clubbing cyanosis, edema  Skin: No rashes, palpation reveals normal skin turgor  Psychiatric: Appropriate judgment and insight, oriented to person, place, and time  Abdomen Inspection Hernias - Bilateral - Inguinal hernia - Reducible - Bilateral.    Assessment & Plan  BILATERAL INGUINAL HERNIA WITHOUT OBSTRUCTION OR GANGRENE, RECURRENCE NOT SPECIFIED (K40.20) Impression: 59 year old male with bilateral hernias, likely indirect. 1. The patient will like to proceed to the operating room for laparoscopic bilateral inguinal hernia repair with mesh.  2. I discussed with the patient the signs and symptoms of incarceration and strangulation and the need to proceed to the ER should they occur.  3. I discussed with the patient the risks and benefits of the procedure to include but not limited to: Infection, bleeding, damage to surrounding structures, possible need for further surgery, possible nerve pain, and possible recurrence. The patient was understanding and wishes to proceed.  I reviewed the patient's external notes from the referring physicians as well as consulting physician team.

## 2019-12-15 NOTE — Op Note (Signed)
12/15/2019  11:49 AM  PATIENT:  Barry Davis  59 y.o. male  PRE-OPERATIVE DIAGNOSIS:  Bilateral inguinal hernias  POST-OPERATIVE DIAGNOSIS:  Bilateral inguinal hernias, direct  PROCEDURE:  Procedure(s): LAPAROSCOPIC BILATERAL INGUINAL HERNIA REPAIR WITH MESH (Bilateral) Insertion Of Mesh (Bilateral)  SURGEON:  Surgeon(s) and Role:    Ralene Ok, MD - Primary  ASSISTANTS: Pryor Curia, RNFA   ANESTHESIA:   local and general  EBL:  10 mL   BLOOD ADMINISTERED:none  DRAINS: none   LOCAL MEDICATIONS USED:  BUPIVICAINE   SPECIMEN:  No Specimen  DISPOSITION OF SPECIMEN:  N/A  COUNTS:  YES  TOURNIQUET:  * No tourniquets in log *  DICTATION: .Dragon Dictation   Counts: reported as correct x 2  Findings:  The patient had a small bilateral direct hernias  Indications for procedure:  The patient is a 59 year old male with a bilateral hernias for several months. Patient complained of symptomatology to his bilateral inguinal areas. The patient was taken back for elective inguinal hernia repair.  Details of the procedure: The patient was taken back to the operating room. The patient was placed in supine position with bilateral SCDs in place.  The patient underwent GETA.  A foley catheter was placed. The patient was prepped and draped in the usual sterile fashion.  After appropriate anitbiotics were confirmed, a time-out was confirmed and all facts were verified.  0.25% Marcaine was used to infiltrate the umbilical area. A 11-blade was used to cut down the skin and blunt dissection was used to get the anterior fashion.  The anterior fascia was incised approximately 1 cm and the muscles were retracted laterally. Blunt dissection was then used to create a space in the preperitoneal area. At this time a 10 mm camera was then introduced into the space and advanced the pubic tubercle and a 12 mm trocar was placed over this and insufflation was started.  At this time and space  was created from medial to laterally the preperitoneal space on the patient's left side..  Cooper's ligament was initially cleaned off.  The hernia sac was seen in the direct space.  The was dissected and the transversalis fascia reduced spontaneously.  The peritoenum was identified and dissected away from the cremesteric muscle fibers.   Dissection of the peritoneum was undertaken the vas deferens was identified and protected in all parts of the case.   Once the hernia sac was taken down to approximately the umbilicus a Bard 3D Max mesh, size: Rachelle Hora, was  introduced into the preperitoneal space.  The mesh was brought over to cover the direct and indirect hernia spaces.  This was anchored into place and secured to Cooper's ligament with 4.82mm staples from a [place] hernia stapler. It was anchored to the anterior abdominal wall with 4.8 mm staples. The hernia sac was seen lying posterior to the mesh. There was no staples placed laterally.   The exact same dissection took place on the right side. The spermatic cord was identified.  The peritoneum was identified and dissected away from the cremesteric muscle fibers.  The hernia was seen in the direct space and was dissected away and it retracted.   Dissection of the peritoneum was undertaken the vas deferens was identified and protected in all parts of the case.  There was definitely more scar tissue on the right due to his previous open appendectomy.  The insufflation was evacuated and the peritoneum was seen posterior to the mesh bilaterally. The trochars were removed. The  anterior fascia was reapproximated using #1 Vicryl on a UR- 6.  Intra-abdominal air was evacuated and the Veress needle removed. The skin was reapproximated using 4-0 Monocryl subcuticular fashion and was dressed with Dermabond. The  patient was awakened from general anesthesia and taken to recovery in stable condition.    PLAN OF CARE: Discharge to home after PACU  PATIENT  DISPOSITION:  PACU - hemodynamically stable.   Delay start of Pharmacological VTE agent (>24hrs) due to surgical blood loss or risk of bleeding: not applicable

## 2019-12-15 NOTE — Anesthesia Preprocedure Evaluation (Addendum)
Anesthesia Evaluation  Patient identified by MRN, date of birth, ID band Patient awake    Reviewed: Allergy & Precautions, NPO status , Patient's Chart, lab work & pertinent test results  History of Anesthesia Complications Negative for: history of anesthetic complications  Airway Mallampati: II  TM Distance: >3 FB Neck ROM: Full    Dental  (+) Dental Advisory Given, Caps   Pulmonary COPD, former smoker,    Pulmonary exam normal        Cardiovascular Normal cardiovascular exam+ dysrhythmias (formerly on metoprolol)      Neuro/Psych PSYCHIATRIC DISORDERS Anxiety Bipolar Disorder negative neurological ROS     GI/Hepatic GERD  Controlled,(+)     substance abuse  marijuana use, Hepatitis -, B  Endo/Other  negative endocrine ROS  Renal/GU negative Renal ROS     Musculoskeletal  (+) Arthritis ,   Abdominal   Peds  Hematology negative hematology ROS (+)   Anesthesia Other Findings Covid 12/13/19   Reproductive/Obstetrics                            Anesthesia Physical Anesthesia Plan  ASA: II  Anesthesia Plan: General   Post-op Pain Management:    Induction: Intravenous  PONV Risk Score and Plan: 3 and Treatment may vary due to age or medical condition, Ondansetron, Dexamethasone and Midazolam  Airway Management Planned: Oral ETT  Additional Equipment: None  Intra-op Plan:   Post-operative Plan: Extubation in OR  Informed Consent: I have reviewed the patients History and Physical, chart, labs and discussed the procedure including the risks, benefits and alternatives for the proposed anesthesia with the patient or authorized representative who has indicated his/her understanding and acceptance.     Dental advisory given  Plan Discussed with: CRNA and Anesthesiologist  Anesthesia Plan Comments:        Anesthesia Quick Evaluation

## 2019-12-16 ENCOUNTER — Encounter: Payer: Self-pay | Admitting: *Deleted

## 2019-12-16 NOTE — Anesthesia Postprocedure Evaluation (Signed)
Anesthesia Post Note  Patient: Barry Davis  Procedure(s) Performed: LAPAROSCOPIC BILATERAL INGUINAL HERNIA REPAIR WITH MESH (Bilateral Inguinal) Insertion Of Mesh (Bilateral Inguinal)     Patient location during evaluation: PACU Anesthesia Type: General Level of consciousness: awake and alert Pain management: pain level controlled Vital Signs Assessment: post-procedure vital signs reviewed and stable Respiratory status: spontaneous breathing, nonlabored ventilation and respiratory function stable Cardiovascular status: blood pressure returned to baseline and stable Postop Assessment: no apparent nausea or vomiting Anesthetic complications: no    Last Vitals:  Vitals:   12/15/19 1327 12/15/19 1337  BP: 97/66 103/65  Pulse: (!) 52 (!) 54  Resp:    Temp:    SpO2: 98% 94%    Last Pain:  Vitals:   12/15/19 1355  PainSc: 3                  Beryle Lathe

## 2020-03-17 ENCOUNTER — Other Ambulatory Visit: Payer: Self-pay

## 2020-03-17 ENCOUNTER — Ambulatory Visit (INDEPENDENT_AMBULATORY_CARE_PROVIDER_SITE_OTHER): Payer: 59 | Admitting: Cardiovascular Disease

## 2020-03-17 ENCOUNTER — Encounter: Payer: Self-pay | Admitting: Cardiovascular Disease

## 2020-03-17 VITALS — BP 102/70 | HR 69 | Ht 71.0 in | Wt 165.2 lb

## 2020-03-17 DIAGNOSIS — Z79899 Other long term (current) drug therapy: Secondary | ICD-10-CM | POA: Diagnosis not present

## 2020-03-17 DIAGNOSIS — R0789 Other chest pain: Secondary | ICD-10-CM | POA: Diagnosis not present

## 2020-03-17 DIAGNOSIS — E785 Hyperlipidemia, unspecified: Secondary | ICD-10-CM | POA: Insufficient documentation

## 2020-03-17 DIAGNOSIS — E782 Mixed hyperlipidemia: Secondary | ICD-10-CM

## 2020-03-17 DIAGNOSIS — R002 Palpitations: Secondary | ICD-10-CM | POA: Diagnosis not present

## 2020-03-17 NOTE — Assessment & Plan Note (Signed)
History of hyperlipidemia with lipid profile performed 11/26/2019 revealing cholesterol 185, LDL 128 and HDL 43.  He does admit to dietary discretion.  I am providing him with a heart healthy diet and we will recheck a lipid and liver profile in 3 months.

## 2020-03-17 NOTE — Assessment & Plan Note (Signed)
History of palpitations in the past with event monitors that showed PACs.  I have reassured him about these and have offered low-dose beta-blockade which she has refused to take.  Does not drink decaffeinated coffee.  His episodes of PACs are few and far between.  He did have an episode of tachypalpitations several weeks ago when walking up 3 flights of stairs which lasted 1 to 2 minutes and resolve spontaneously.

## 2020-03-17 NOTE — Progress Notes (Signed)
03/17/2020 Barry Davis   [birth year]  [id number]  Primary Physician Rankins, Bill Salinas, MD Primary Cardiologist: Lorretta Harp MD Lupe Carney, [place]  HPI:  Barry Davis is a 59 y.o.  thin-appearing divorced/widowed Caucasian male with no children, who  worksas a florist. HeWasreferred by the emergency room because of palpitations and atypical chest pain.I last saw him in the office  11/05/2019.His primary care physician is Dr. Harlene Ramus.  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  He does smoke occasional pot. There no other cardiovascular risk factors. He did have hepatitis as a child. He has bipolar disorder and anxiety and has been on Seroquel which was recently discontinued this resulted in lack of sleep, increased anxiety and tachycardia palpitations as well as atypical chest pain.  His major complaints are of palpitations. He has had event monitors performed on 11/02/2015 and again on 01/23/2017 showed PACs. I did begin him empirically on a beta-blocker which she decided not to take. He does complain of palpitations which I reassured him are benign. A 2D echo performed 3 years ago was completely normal. 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  Since I saw him 4 months ago he did have a episode of tachypalpitations 3 weeks ago when walking up 3 flights of stairs which lasted for 1 or 2 minutes and resolve spontaneously.  Does get occasional atypical chest pain and left upper extremity discomfort.   No outpatient medications have been marked as taking for the 03/17/20 encounter (Office Visit) with Lorretta Harp, MD.     Allergies  Allergen Reactions  . Seroquel [Quetiapine] Palpitations  . Sulfa Antibiotics Anaphylaxis    Couldn't breathe    Social History   Socioeconomic History  . Marital status: Divorced    Spouse name: Not on file  . Number of children: Not on  file  . Years of education: Not on file  . Highest education level: Not on file  Occupational History  . Not on file  Tobacco Use  . Smoking status: Former Smoker    Quit date: 12/24/2014    Years since quitting: 5.2  . Smokeless tobacco: Never Used  Vaping Use  . Vaping Use: Former  Substance and Sexual Activity  . Alcohol use: No  . Drug use: Yes    Types: Marijuana    Comment: daily  . Sexual activity: Not on file  Other Topics Concern  . Not on file  Social History Narrative  . Not on file   Social Determinants of Health   Financial Resource Strain:   . Difficulty of Paying Living Expenses:   Food Insecurity:   . Worried About Charity fundraiser in the Last Year:   . Arboriculturist in the Last Year:   Transportation Needs:   . Film/video editor (Medical):   Marland Kitchen Lack of Transportation (Non-Medical):   Physical Activity:   . Days of Exercise per Week:   . Minutes of Exercise per Session:   Stress:   . Feeling of Stress :   Social Connections:   . Frequency of Communication with Friends and Family:   . Frequency of Social Gatherings with Friends and Family:   . Attends Religious Services:   . Active Member of Clubs or Organizations:   . Attends Archivist Meetings:   Marland Kitchen Marital Status:   Intimate Partner Violence:   . Fear of Current or  Ex-Partner:   . Emotionally Abused:   Marland Kitchen Physically Abused:   . Sexually Abused:      Review of Systems: General: negative for chills, fever, night sweats or weight changes.  Cardiovascular: negative for chest pain, dyspnea on exertion, edema, orthopnea, palpitations, paroxysmal nocturnal dyspnea or shortness of breath Dermatological: negative for rash Respiratory: negative for cough or wheezing Urologic: negative for hematuria Abdominal: negative for nausea, vomiting, diarrhea, bright red blood per rectum, melena, or hematemesis Neurologic: negative for visual changes, syncope, or dizziness All other systems  reviewed and are otherwise negative except as noted above.    Blood pressure 102/70, pulse 69, height 5\' 11"  (1.803 m), weight 165 lb 3.2 oz (74.9 kg), SpO2 96 %.  General appearance: alert and no distress Neck: no adenopathy, no carotid bruit, no JVD, supple, symmetrical, trachea midline and thyroid not enlarged, symmetric, no tenderness/mass/nodules Lungs: clear to auscultation bilaterally Heart: regular rate and rhythm, S1, S2 normal, no murmur, click, rub or gallop Extremities: extremities normal, atraumatic, no cyanosis or edema Pulses: 2+ and symmetric Skin: Skin color, texture, turgor normal. No rashes or lesions Neurologic: Alert and oriented X 3, normal strength and tone. Normal symmetric reflexes. Normal coordination and gait  EKG not performed today  ASSESSMENT AND PLAN:   Palpitations History of palpitations in the past with event monitors that showed PACs.  I have reassured him about these and have offered low-dose beta-blockade which she has refused to take.  Does not drink decaffeinated coffee.  His episodes of PACs are few and far between.  He did have an episode of tachypalpitations several weeks ago when walking up 3 flights of stairs which lasted 1 to 2 minutes and resolve spontaneously.  Atypical chest pain History of atypical chest pain.  Getting a coronary calcium score to risk stratify.  He carries a diagnosis of pseudoxanthoma, elasticum (PXE).  Hyperlipidemia History of hyperlipidemia with lipid profile performed 11/26/2019 revealing cholesterol 185, LDL 128 and HDL 43.  He does admit to dietary discretion.  I am providing him with a heart healthy diet and we will recheck a lipid and liver profile in 3 months.      11/28/2019 MD FACP,FACC,FAHA, [facility] 03/17/2020 4:47 PM

## 2020-03-17 NOTE — Patient Instructions (Addendum)
Medication Instructions:  Your Physician recommend you continue on your current medication as directed.    *If you need a refill on your cardiac medications before your next appointment, please call your pharmacy*   Lab Work: Your physician recommends that you return for lab work in 3 months ( Fasting lipid, LFT)   Testing/Procedures: Calcium Score [address]   Follow-Up: At [facility], you and your health needs are our priority.  As part of our continuing mission to provide you with exceptional heart care, we have created designated Provider Care Teams.  These Care Teams include your primary Cardiologist (physician) and Advanced Practice Providers (APPs -  Physician Assistants and Nurse Practitioners) who all work together to provide you with the care you need, when you need it.  We recommend signing up for the patient portal called "MyChart".  Sign up information is provided on this After Visit Summary.  MyChart is used to connect with patients for Virtual Visits (Telemedicine).  Patients are able to view lab/test results, encounter notes, upcoming appointments, etc.  Non-urgent messages can be sent to your provider as well.   To learn more about what you can do with MyChart, go to [email or web address].    Your next appointment:   1 year(s)  The format for your next appointment:   In Person  Provider:   Nanetta Batty, MD  Your physician recommends that you schedule a follow-up appointment in 6 month with Corine Shelter, PA   Heart-Healthy Eating Plan Heart-healthy meal planning includes:  Eating less unhealthy fats.  Eating more healthy fats.  Making other changes in your diet. Talk with your doctor or a diet specialist (dietitian) to create an eating plan that is right for you.  What are tips for following this plan? Cooking Avoid frying your food. Try to bake, boil, grill, or broil it instead. You can also reduce fat by:  Removing the skin  from poultry.  Removing all visible fats from meats.  Steaming vegetables in water or broth. Meal planning   At meals, divide your plate into four equal parts: ? Fill one-half of your plate with vegetables and green salads. ? Fill one-fourth of your plate with whole grains. ? Fill one-fourth of your plate with lean protein foods.  Eat 4-5 servings of vegetables per day. A serving of vegetables is: ? 1 cup of raw or cooked vegetables. ? 2 cups of raw leafy greens.  Eat 4-5 servings of fruit per day. A serving of fruit is: ? 1 medium whole fruit. ?  cup of dried fruit. ?  cup of fresh, frozen, or canned fruit. ?  cup of 100% fruit juice.  Eat more foods that have soluble fiber. These are apples, broccoli, carrots, beans, peas, and barley. Try to get 20-30 g of fiber per day.  Eat 4-5 servings of nuts, legumes, and seeds per week: ? 1 serving of dried beans or legumes equals  cup after being cooked. ? 1 serving of nuts is  cup. ? 1 serving of seeds equals 1 tablespoon. General information  Eat more home-cooked food. Eat less restaurant, buffet, and fast food.  Limit or avoid alcohol.  Limit foods that are high in starch and sugar.  Avoid fried foods.  Lose weight if you are overweight.  Keep track of how much salt (sodium) you eat. This is important if you have high blood pressure. Ask your doctor to tell you more about this.  Try to add vegetarian  meals each week. Fats  Choose healthy fats. These include olive oil and canola oil, flaxseeds, walnuts, almonds, and seeds.  Eat more omega-3 fats. These include salmon, mackerel, sardines, tuna, flaxseed oil, and ground flaxseeds. Try to eat fish at least 2 times each week.  Check food labels. Avoid foods with trans fats or high amounts of saturated fat.  Limit saturated fats. ? These are often found in animal products, such as meats, butter, and cream. ? These are also found in plant foods, such as palm oil, palm  kernel oil, and coconut oil.  Avoid foods with partially hydrogenated oils in them. These have trans fats. Examples are stick margarine, some tub margarines, cookies, crackers, and other baked goods. What foods can I eat? Fruits All fresh, canned (in natural juice), or frozen fruits. Vegetables Fresh or frozen vegetables (raw, steamed, roasted, or grilled). Green salads. Grains Most grains. Choose whole wheat and whole grains most of the time. Rice and pasta, including brown rice and pastas made with whole wheat. Meats and other proteins Lean, well-trimmed beef, veal, pork, and lamb. Chicken and [place] without skin. All fish and shellfish. Wild duck, rabbit, pheasant, and venison. Egg whites or low-cholesterol egg substitutes. Dried beans, peas, lentils, and tofu. Seeds and most nuts. Dairy Low-fat or nonfat cheeses, including ricotta and mozzarella. Skim or 1% milk that is liquid, powdered, or evaporated. Buttermilk that is made with low-fat milk. Nonfat or low-fat yogurt. Fats and oils Non-hydrogenated (trans-free) margarines. Vegetable oils, including soybean, sesame, sunflower, olive, peanut, safflower, corn, canola, and cottonseed. Salad dressings or mayonnaise made with a vegetable oil. Beverages Mineral water. Coffee and tea. Diet carbonated beverages. Sweets and desserts Sherbet, gelatin, and fruit ice. Small amounts of dark chocolate. Limit all sweets and desserts. Seasonings and condiments All seasonings and condiments. The items listed above may not be a complete list of foods and drinks you can eat. Contact a dietitian for more options. What foods should I avoid? Fruits Canned fruit in heavy syrup. Fruit in cream or butter sauce. Fried fruit. Limit coconut. Vegetables Vegetables cooked in cheese, cream, or butter sauce. Fried vegetables. Grains Breads that are made with saturated or trans fats, oils, or whole milk. Croissants. Sweet rolls. Donuts. High-fat crackers, such as  cheese crackers. Meats and other proteins Fatty meats, such as hot dogs, ribs, sausage, bacon, rib-eye roast or steak. High-fat deli meats, such as salami and bologna. Caviar. Domestic duck and goose. Organ meats, such as liver. Dairy Cream, sour cream, cream cheese, and creamed cottage cheese. Whole-milk cheeses. Whole or 2% milk that is liquid, evaporated, or condensed. Whole buttermilk. Cream sauce or high-fat cheese sauce. Yogurt that is made from whole milk. Fats and oils Meat fat, or shortening. Cocoa butter, hydrogenated oils, palm oil, coconut oil, palm kernel oil. Solid fats and shortenings, including bacon fat, salt pork, lard, and butter. Nondairy cream substitutes. Salad dressings with cheese or sour cream. Beverages Regular sodas and juice drinks with added sugar. Sweets and desserts Frosting. Pudding. Cookies. Cakes. Pies. Milk chocolate or white chocolate. Buttered syrups. Full-fat ice cream or ice cream drinks. The items listed above may not be a complete list of foods and drinks to avoid. Contact a dietitian for more information. Summary  Heart-healthy meal planning includes eating less unhealthy fats, eating more healthy fats, and making other changes in your diet.  Eat a balanced diet. This includes fruits and vegetables, low-fat or nonfat dairy, lean protein, nuts and legumes, whole grains, and heart-healthy oils  and fats. This information is not intended to replace advice given to you by your health care provider. Make sure you discuss any questions you have with your health care provider. Document Revised: 11/20/2017 Document Reviewed: 10/24/2017 Elsevier Patient Education  2020 [employer].

## 2020-03-17 NOTE — Assessment & Plan Note (Signed)
History of atypical chest pain.  Getting a coronary calcium score to risk stratify.  He carries a diagnosis of pseudoxanthoma, elasticum (PXE).

## 2020-03-20 ENCOUNTER — Telehealth: Payer: Self-pay | Admitting: Emergency Medicine

## 2020-03-20 NOTE — Telephone Encounter (Signed)
Pt is returning a call to schedule calcium score. Will send a message to scheduling.

## 2020-03-20 NOTE — Telephone Encounter (Signed)
Patient is returning call. Advised to call [phone].

## 2020-03-20 NOTE — Telephone Encounter (Signed)
Patient needs CT calcium score scheduled. Routing to CT Department.

## 2020-03-28 ENCOUNTER — Other Ambulatory Visit: Payer: 59

## 2020-04-06 ENCOUNTER — Inpatient Hospital Stay: Admission: RE | Admit: 2020-04-06 | Payer: 59 | Source: Ambulatory Visit

## 2020-04-17 ENCOUNTER — Ambulatory Visit (INDEPENDENT_AMBULATORY_CARE_PROVIDER_SITE_OTHER)
Admission: RE | Admit: 2020-04-17 | Discharge: 2020-04-17 | Disposition: A | Discharge status: Home / Self Care | Payer: Self-pay | Source: Ambulatory Visit | Attending: Cardiovascular Disease | Admitting: Cardiovascular Disease

## 2020-04-17 ENCOUNTER — Other Ambulatory Visit: Payer: Self-pay

## 2020-04-17 DIAGNOSIS — R0789 Other chest pain: Secondary | ICD-10-CM

## 2020-05-08 ENCOUNTER — Encounter: Payer: Self-pay | Admitting: Registered"

## 2020-05-08 ENCOUNTER — Encounter: Payer: 59 | Attending: Family Medicine | Admitting: Registered"

## 2020-05-08 ENCOUNTER — Other Ambulatory Visit: Payer: Self-pay

## 2020-05-08 DIAGNOSIS — R7303 Prediabetes: Secondary | ICD-10-CM | POA: Diagnosis present

## 2020-05-08 NOTE — Patient Instructions (Addendum)
Consider return to your routine including weight when cleared from your MD  For more healthy eating tips: [email or web address] CueTune.com.ee For whole grain ideas:  EclipseTool.co.uk Mediterranean cooking class ShippingScam.com  For cholesterol: Whole grain breads, cereal, pasta Chia seeds, ground flaxseed Consider eating pistachios regularly

## 2020-05-08 NOTE — Progress Notes (Signed)
Medical Nutrition Therapy:  Appt start time: 1000 end time:  1100.   Assessment:  Primary concerns today: prediabetes (A1c 5.8% per referral), fatty liver, elevated cholesterol. Pt states he doesn't want to take anything for it.  Body Composition Scale Date  Current Body Weight 163.3  Total Body Fat % 16.1  Visceral Fat Healthy   1-9 High        10-14 Very High 15+ 8  Fat-Free Mass % 83.8   Total Body Water % 64.8  Muscle-Mass lbs 30.6  BMI 23.8   Due to Pt concern regarding fatty liver, RD obtained body composition to evaluate visceral fat.* Pt's visceral fat level is in the healthy range.  * Ref: Visceral fat: a key mediator of steatohepatitis in metabolic liver disease; DOI: 10.1002/hep.22350 (from abstract) Conclusion: Visceral fat is directly associated with liver inflammation and fibrosis independent of insulin resistance and hepatic steatosis. Visceral fat should therefore be a central target for future interventions in nonalcoholic steatohepatitis and indeed all metabolic disease.  Pt states he when he was going to the gym he didn't lift heavy weights because he didn't want to gain weight. Pt states he liked riding the bike at gym and lifting some weights every other day until started having neck issues 3 months ago. Pt states it also hurts to walk due to bone spur and plantar fascitis   Pt states he has a lot of stress in his life right now. Part of it due to macular degeneration which has greatly affected his vision.  Pt states he reduced sugar and lost weight around his stomach. Pt reports he also stopped eating biscuits, processed meat. Pt states he has increased fish and chicken intake and reduce pork and beef intake.  Sleep: "Terrible" Has to get up a lot to urinate. Pt states he thinks it's due to prostate, tried flomax but because it made him urinate more he stopped and hasn't been back to MD for alternative treatment.  Preferred Learning Style:   No preference  indicated   Learning Readiness:  Change in progress   MEDICATIONS: reviewed   DIETARY INTAKE:  Usual eating pattern includes 3 meals and 2-3 snacks per day.  24-hr recall:  B ( AM): 2 boiled eggs, whole wheat english muffin OR oatmeal, blueberries, maple syrup  Snk ( AM): cookies,  L ( PM): cheese quesadilla Snk ( PM):  D ( PM): mashed potatoes, gravy, lima beans, country fried steak Snk ( PM): ice cream, apple, carrots Beverages: water, decaf tea sugar, 6-8 oz orange juice  Usual physical activity: ADLs due to bone spur and plantar fascitis, plans to be active again once it is taken care of.  Estimated energy needs: not assessed   Progress Towards Goal(s):  New goals.   Nutritional Diagnosis:  NB-1.1 Food and nutrition-related knowledge deficit As related to foods that are recommended for blood sugar control.  As evidenced by patient stated knowledge based on internet search stating to use honey and maple syrup for sweetner for diabetes..    Intervention:  Nutrition education for managing blood glucose with diet and lifestyle changes. Described diabetes. Stated some common risk factors for diabetes.  Defined the role of glucose and insulin.  Identified type of diabetes and pathophysiology.  Described the relationship between diabetes and cardiovascular risk.  Described the role of different macronutrients on glucose.  Explained how carbohydrates affect blood glucose.  Stated what foods contain the most carbohydrates.    Plan: Consider return to your routine  including weight when cleared from your MD For more healthy eating tips: [email or web address] CueTune.com.ee For whole grain ideas:  EclipseTool.co.uk Mediterranean cooking class ShippingScam.com  For cholesterol: Whole grain breads, cereal, pasta Chia seeds, ground flaxseed Consider eating pistachios  regularly  Teaching Method Utilized:  Visual Auditory Hands on  Handouts given during visit include:  Snack sheet  Healthy Meal Planner  Barriers to learning/adherence to lifestyle change: none  Demonstrated degree of understanding via:  Teach Back   Monitoring/Evaluation:  Dietary intake, exercise, and body weight in 5 week(s).

## 2020-05-09 DIAGNOSIS — R7303 Prediabetes: Secondary | ICD-10-CM | POA: Insufficient documentation

## 2020-07-10 ENCOUNTER — Ambulatory Visit: Payer: 59 | Admitting: Registered"

## 2020-07-26 ENCOUNTER — Other Ambulatory Visit: Payer: Self-pay | Admitting: Gastroenterology

## 2020-07-26 DIAGNOSIS — R7989 Other specified abnormal findings of blood chemistry: Secondary | ICD-10-CM

## 2020-07-31 ENCOUNTER — Encounter: Payer: 59 | Attending: Family Medicine | Admitting: Registered"

## 2020-07-31 DIAGNOSIS — R7303 Prediabetes: Secondary | ICD-10-CM | POA: Insufficient documentation

## 2020-08-02 ENCOUNTER — Ambulatory Visit
Admission: RE | Admit: 2020-08-02 | Discharge: 2020-08-02 | Disposition: A | Discharge status: Home / Self Care | Payer: 59 | Source: Ambulatory Visit | Attending: Gastroenterology | Admitting: Gastroenterology

## 2020-08-02 DIAGNOSIS — R7989 Other specified abnormal findings of blood chemistry: Secondary | ICD-10-CM

## 2020-10-05 DIAGNOSIS — R69 Illness, unspecified: Secondary | ICD-10-CM | POA: Diagnosis not present

## 2020-10-05 DIAGNOSIS — M722 Plantar fascial fibromatosis: Secondary | ICD-10-CM | POA: Diagnosis not present

## 2020-10-05 DIAGNOSIS — F331 Major depressive disorder, recurrent, moderate: Secondary | ICD-10-CM | POA: Diagnosis not present

## 2020-10-09 ENCOUNTER — Telehealth: Payer: Medicare HMO | Admitting: Cardiology

## 2020-10-09 ENCOUNTER — Telehealth: Payer: Self-pay

## 2020-10-09 NOTE — Telephone Encounter (Signed)
Attempted to contact pt x 3 for virtual appointment today at 10:15 am with Corine Shelter, PA. Left message to call back to reschedule.

## 2020-10-25 DIAGNOSIS — F331 Major depressive disorder, recurrent, moderate: Secondary | ICD-10-CM | POA: Diagnosis not present

## 2020-10-25 DIAGNOSIS — R69 Illness, unspecified: Secondary | ICD-10-CM | POA: Diagnosis not present

## 2020-11-02 DIAGNOSIS — F331 Major depressive disorder, recurrent, moderate: Secondary | ICD-10-CM | POA: Diagnosis not present

## 2020-11-02 DIAGNOSIS — R69 Illness, unspecified: Secondary | ICD-10-CM | POA: Diagnosis not present

## 2020-11-14 DIAGNOSIS — R351 Nocturia: Secondary | ICD-10-CM | POA: Diagnosis not present

## 2020-11-14 DIAGNOSIS — N5201 Erectile dysfunction due to arterial insufficiency: Secondary | ICD-10-CM | POA: Diagnosis not present

## 2020-11-14 DIAGNOSIS — N401 Enlarged prostate with lower urinary tract symptoms: Secondary | ICD-10-CM | POA: Diagnosis not present

## 2020-11-14 DIAGNOSIS — R3912 Poor urinary stream: Secondary | ICD-10-CM | POA: Diagnosis not present

## 2020-11-15 DIAGNOSIS — H31091 Other chorioretinal scars, right eye: Secondary | ICD-10-CM | POA: Diagnosis not present

## 2020-11-15 DIAGNOSIS — H43813 Vitreous degeneration, bilateral: Secondary | ICD-10-CM | POA: Diagnosis not present

## 2020-11-15 DIAGNOSIS — H35053 Retinal neovascularization, unspecified, bilateral: Secondary | ICD-10-CM | POA: Diagnosis not present

## 2020-11-15 DIAGNOSIS — H31013 Macula scars of posterior pole (postinflammatory) (post-traumatic), bilateral: Secondary | ICD-10-CM | POA: Diagnosis not present

## 2020-11-16 DIAGNOSIS — R69 Illness, unspecified: Secondary | ICD-10-CM | POA: Diagnosis not present

## 2020-11-16 DIAGNOSIS — F331 Major depressive disorder, recurrent, moderate: Secondary | ICD-10-CM | POA: Diagnosis not present

## 2020-11-30 DIAGNOSIS — F331 Major depressive disorder, recurrent, moderate: Secondary | ICD-10-CM | POA: Diagnosis not present

## 2020-11-30 DIAGNOSIS — R69 Illness, unspecified: Secondary | ICD-10-CM | POA: Diagnosis not present

## 2020-12-14 DIAGNOSIS — R69 Illness, unspecified: Secondary | ICD-10-CM | POA: Diagnosis not present

## 2020-12-14 DIAGNOSIS — F331 Major depressive disorder, recurrent, moderate: Secondary | ICD-10-CM | POA: Diagnosis not present

## 2020-12-21 DIAGNOSIS — M7022 Olecranon bursitis, left elbow: Secondary | ICD-10-CM | POA: Diagnosis not present

## 2020-12-28 DIAGNOSIS — F331 Major depressive disorder, recurrent, moderate: Secondary | ICD-10-CM | POA: Diagnosis not present

## 2020-12-28 DIAGNOSIS — R69 Illness, unspecified: Secondary | ICD-10-CM | POA: Diagnosis not present

## 2021-01-03 DIAGNOSIS — H353211 Exudative age-related macular degeneration, right eye, with active choroidal neovascularization: Secondary | ICD-10-CM | POA: Diagnosis not present

## 2021-01-11 DIAGNOSIS — F331 Major depressive disorder, recurrent, moderate: Secondary | ICD-10-CM | POA: Diagnosis not present

## 2021-01-11 DIAGNOSIS — R69 Illness, unspecified: Secondary | ICD-10-CM | POA: Diagnosis not present

## 2021-01-19 DIAGNOSIS — Z808 Family history of malignant neoplasm of other organs or systems: Secondary | ICD-10-CM | POA: Diagnosis not present

## 2021-01-19 DIAGNOSIS — Z8051 Family history of malignant neoplasm of kidney: Secondary | ICD-10-CM | POA: Diagnosis not present

## 2021-01-19 DIAGNOSIS — J449 Chronic obstructive pulmonary disease, unspecified: Secondary | ICD-10-CM | POA: Diagnosis not present

## 2021-01-19 DIAGNOSIS — R69 Illness, unspecified: Secondary | ICD-10-CM | POA: Diagnosis not present

## 2021-01-19 DIAGNOSIS — Z8269 Family history of other diseases of the musculoskeletal system and connective tissue: Secondary | ICD-10-CM | POA: Diagnosis not present

## 2021-01-19 DIAGNOSIS — Z823 Family history of stroke: Secondary | ICD-10-CM | POA: Diagnosis not present

## 2021-01-19 DIAGNOSIS — I499 Cardiac arrhythmia, unspecified: Secondary | ICD-10-CM | POA: Diagnosis not present

## 2021-01-19 DIAGNOSIS — H3530 Unspecified macular degeneration: Secondary | ICD-10-CM | POA: Diagnosis not present

## 2021-01-19 DIAGNOSIS — Z8052 Family history of malignant neoplasm of bladder: Secondary | ICD-10-CM | POA: Diagnosis not present

## 2021-01-25 DIAGNOSIS — R69 Illness, unspecified: Secondary | ICD-10-CM | POA: Diagnosis not present

## 2021-01-25 DIAGNOSIS — F331 Major depressive disorder, recurrent, moderate: Secondary | ICD-10-CM | POA: Diagnosis not present

## 2021-02-07 DIAGNOSIS — R69 Illness, unspecified: Secondary | ICD-10-CM | POA: Diagnosis not present

## 2021-02-07 DIAGNOSIS — F331 Major depressive disorder, recurrent, moderate: Secondary | ICD-10-CM | POA: Diagnosis not present

## 2021-02-08 DIAGNOSIS — R69 Illness, unspecified: Secondary | ICD-10-CM | POA: Diagnosis not present

## 2021-02-08 DIAGNOSIS — F331 Major depressive disorder, recurrent, moderate: Secondary | ICD-10-CM | POA: Diagnosis not present

## 2021-02-12 DIAGNOSIS — Z125 Encounter for screening for malignant neoplasm of prostate: Secondary | ICD-10-CM | POA: Diagnosis not present

## 2021-02-22 DIAGNOSIS — F331 Major depressive disorder, recurrent, moderate: Secondary | ICD-10-CM | POA: Diagnosis not present

## 2021-02-22 DIAGNOSIS — R69 Illness, unspecified: Secondary | ICD-10-CM | POA: Diagnosis not present

## 2021-03-08 DIAGNOSIS — R69 Illness, unspecified: Secondary | ICD-10-CM | POA: Diagnosis not present

## 2021-03-08 DIAGNOSIS — F331 Major depressive disorder, recurrent, moderate: Secondary | ICD-10-CM | POA: Diagnosis not present

## 2021-03-22 DIAGNOSIS — R69 Illness, unspecified: Secondary | ICD-10-CM | POA: Diagnosis not present

## 2021-03-22 DIAGNOSIS — F331 Major depressive disorder, recurrent, moderate: Secondary | ICD-10-CM | POA: Diagnosis not present

## 2021-03-23 DIAGNOSIS — M65342 Trigger finger, left ring finger: Secondary | ICD-10-CM | POA: Diagnosis not present

## 2021-03-23 DIAGNOSIS — M25531 Pain in right wrist: Secondary | ICD-10-CM | POA: Diagnosis not present

## 2021-04-09 DIAGNOSIS — K760 Fatty (change of) liver, not elsewhere classified: Secondary | ICD-10-CM | POA: Diagnosis not present

## 2021-04-09 DIAGNOSIS — R69 Illness, unspecified: Secondary | ICD-10-CM | POA: Diagnosis not present

## 2021-04-09 DIAGNOSIS — R7303 Prediabetes: Secondary | ICD-10-CM | POA: Diagnosis not present

## 2021-04-09 DIAGNOSIS — Z0000 Encounter for general adult medical examination without abnormal findings: Secondary | ICD-10-CM | POA: Diagnosis not present

## 2021-04-17 DIAGNOSIS — Z1211 Encounter for screening for malignant neoplasm of colon: Secondary | ICD-10-CM | POA: Diagnosis not present

## 2021-04-17 DIAGNOSIS — K5730 Diverticulosis of large intestine without perforation or abscess without bleeding: Secondary | ICD-10-CM | POA: Diagnosis not present

## 2021-04-17 DIAGNOSIS — Z86010 Personal history of colonic polyps: Secondary | ICD-10-CM | POA: Diagnosis not present

## 2021-04-17 DIAGNOSIS — K760 Fatty (change of) liver, not elsewhere classified: Secondary | ICD-10-CM | POA: Diagnosis not present

## 2021-04-19 DIAGNOSIS — R69 Illness, unspecified: Secondary | ICD-10-CM | POA: Diagnosis not present

## 2021-04-19 DIAGNOSIS — F331 Major depressive disorder, recurrent, moderate: Secondary | ICD-10-CM | POA: Diagnosis not present

## 2021-05-03 DIAGNOSIS — R69 Illness, unspecified: Secondary | ICD-10-CM | POA: Diagnosis not present

## 2021-05-03 DIAGNOSIS — F331 Major depressive disorder, recurrent, moderate: Secondary | ICD-10-CM | POA: Diagnosis not present

## 2021-05-09 DIAGNOSIS — H35053 Retinal neovascularization, unspecified, bilateral: Secondary | ICD-10-CM | POA: Diagnosis not present

## 2021-05-09 DIAGNOSIS — H43813 Vitreous degeneration, bilateral: Secondary | ICD-10-CM | POA: Diagnosis not present

## 2021-05-09 DIAGNOSIS — H31013 Macula scars of posterior pole (postinflammatory) (post-traumatic), bilateral: Secondary | ICD-10-CM | POA: Diagnosis not present

## 2021-05-09 DIAGNOSIS — H43391 Other vitreous opacities, right eye: Secondary | ICD-10-CM | POA: Diagnosis not present

## 2021-05-30 DIAGNOSIS — K5730 Diverticulosis of large intestine without perforation or abscess without bleeding: Secondary | ICD-10-CM | POA: Diagnosis not present

## 2021-05-30 DIAGNOSIS — Z1211 Encounter for screening for malignant neoplasm of colon: Secondary | ICD-10-CM | POA: Diagnosis not present

## 2021-05-30 DIAGNOSIS — Z86010 Personal history of colonic polyps: Secondary | ICD-10-CM | POA: Diagnosis not present

## 2021-06-06 DIAGNOSIS — F331 Major depressive disorder, recurrent, moderate: Secondary | ICD-10-CM | POA: Diagnosis not present

## 2021-06-06 DIAGNOSIS — R69 Illness, unspecified: Secondary | ICD-10-CM | POA: Diagnosis not present

## 2021-06-06 DIAGNOSIS — F411 Generalized anxiety disorder: Secondary | ICD-10-CM | POA: Diagnosis not present

## 2021-07-06 DIAGNOSIS — M65342 Trigger finger, left ring finger: Secondary | ICD-10-CM | POA: Diagnosis not present

## 2021-07-23 DIAGNOSIS — R69 Illness, unspecified: Secondary | ICD-10-CM | POA: Diagnosis not present

## 2021-07-23 DIAGNOSIS — F3181 Bipolar II disorder: Secondary | ICD-10-CM | POA: Diagnosis not present

## 2021-07-23 DIAGNOSIS — F331 Major depressive disorder, recurrent, moderate: Secondary | ICD-10-CM | POA: Diagnosis not present

## 2021-08-02 DIAGNOSIS — R69 Illness, unspecified: Secondary | ICD-10-CM | POA: Diagnosis not present

## 2021-08-02 DIAGNOSIS — F331 Major depressive disorder, recurrent, moderate: Secondary | ICD-10-CM | POA: Diagnosis not present

## 2021-08-02 DIAGNOSIS — F411 Generalized anxiety disorder: Secondary | ICD-10-CM | POA: Diagnosis not present

## 2021-09-01 DIAGNOSIS — B349 Viral infection, unspecified: Secondary | ICD-10-CM | POA: Diagnosis not present

## 2021-09-01 DIAGNOSIS — Z03818 Encounter for observation for suspected exposure to other biological agents ruled out: Secondary | ICD-10-CM | POA: Diagnosis not present

## 2021-09-01 DIAGNOSIS — R509 Fever, unspecified: Secondary | ICD-10-CM | POA: Diagnosis not present

## 2021-09-01 DIAGNOSIS — R1110 Vomiting, unspecified: Secondary | ICD-10-CM | POA: Diagnosis not present

## 2021-09-03 DIAGNOSIS — J101 Influenza due to other identified influenza virus with other respiratory manifestations: Secondary | ICD-10-CM | POA: Diagnosis not present

## 2021-09-06 DIAGNOSIS — F331 Major depressive disorder, recurrent, moderate: Secondary | ICD-10-CM | POA: Diagnosis not present

## 2021-09-06 DIAGNOSIS — R69 Illness, unspecified: Secondary | ICD-10-CM | POA: Diagnosis not present

## 2021-09-06 DIAGNOSIS — F411 Generalized anxiety disorder: Secondary | ICD-10-CM | POA: Diagnosis not present

## 2021-10-15 DIAGNOSIS — R69 Illness, unspecified: Secondary | ICD-10-CM | POA: Diagnosis not present

## 2021-10-15 DIAGNOSIS — F331 Major depressive disorder, recurrent, moderate: Secondary | ICD-10-CM | POA: Diagnosis not present

## 2021-10-15 DIAGNOSIS — F411 Generalized anxiety disorder: Secondary | ICD-10-CM | POA: Diagnosis not present

## 2021-10-25 DIAGNOSIS — R69 Illness, unspecified: Secondary | ICD-10-CM | POA: Diagnosis not present

## 2021-10-25 DIAGNOSIS — F331 Major depressive disorder, recurrent, moderate: Secondary | ICD-10-CM | POA: Diagnosis not present

## 2021-10-25 DIAGNOSIS — F411 Generalized anxiety disorder: Secondary | ICD-10-CM | POA: Diagnosis not present

## 2021-10-29 DIAGNOSIS — H00011 Hordeolum externum right upper eyelid: Secondary | ICD-10-CM | POA: Diagnosis not present

## 2021-11-02 ENCOUNTER — Ambulatory Visit: Payer: Self-pay | Admitting: *Deleted

## 2021-11-02 NOTE — Telephone Encounter (Signed)
Summary: has a stye/questions about it   Pt states he has questions about a stye, and he would like a nurse to call him back.      Chief Complaint: stye- patient has question- can a stye go from one eye to the other? Symptoms: stye diagnosed in R eye Frequency:   Pertinent Negatives: Patient denies   Disposition: [] ED /[] Urgent Care (no appt availability in office) / [] Appointment(In office/virtual)/ []  [place] [facility]/ [] Home Care/ [] Refused Recommended Disposition /[] [place] [facility]/ []  Follow-up with PCP Additional Notes: Patient advised self transmission from bacteria from one eye to the other can occur- patient feels that is what has happened. Patient is under treatmant form his PCP and will follow up if does not improve  Answer Assessment - Initial Assessment Questions 1. LOCATION: "Which eye has the sty?" "Upper or lower eyelid?"     R eye 2. SIZE: "How big is it?" (Note: standard pencil eraser is 6 mm)     Swollen gland on side of face 3. EYELID: "Is the eyelid swollen?" If Yes, ask: "How much?"     bottom 4. REDNESS: "Has the redness spread onto the eyelid?"     yes 5. ONSET: "When did you notice the sty?"     Friday night 6. VISION: "Do you have blurred vision?"      Only when stye opened and drained 7. PAIN: "Is it painful?" If Yes, ask: "How bad is the pain?"  (Scale 1-10; or mild, moderate, severe)     *No Answer* 8. CONTACTS: "Do you wear contacts?"     *No Answer* 9. OTHER SYMPTOMS: "Do you have any other symptoms?" (e.g., fever)     itching 10. PREGNANCY: "Is there any chance you are pregnant?" "When was your last menstrual period?"       *No Answer*  Protocols used: Sty-A-AH

## 2021-11-02 NOTE — Telephone Encounter (Deleted)
°  Chief Complaint: stye- patient has question- can a stye spread from on eye to the other? Symptoms: under treatment from provider with antibiotic Frequency:   Pertinent Negatives: Patient denies   Disposition: [] ED /[] Urgent Care (no appt availability in office) / [] Appointment(In office/virtual)/ []  [place] [facility]/ [] Home Care/ [] Refused Recommended Disposition /[] [place] [facility]/ []  Follow-up with PCP Additional Notes: ***

## 2021-11-07 DIAGNOSIS — H35053 Retinal neovascularization, unspecified, bilateral: Secondary | ICD-10-CM | POA: Diagnosis not present

## 2021-11-07 DIAGNOSIS — H43813 Vitreous degeneration, bilateral: Secondary | ICD-10-CM | POA: Diagnosis not present

## 2021-11-07 DIAGNOSIS — H31013 Macula scars of posterior pole (postinflammatory) (post-traumatic), bilateral: Secondary | ICD-10-CM | POA: Diagnosis not present

## 2021-11-07 DIAGNOSIS — H43391 Other vitreous opacities, right eye: Secondary | ICD-10-CM | POA: Diagnosis not present

## 2021-11-08 DIAGNOSIS — F331 Major depressive disorder, recurrent, moderate: Secondary | ICD-10-CM | POA: Diagnosis not present

## 2021-11-08 DIAGNOSIS — F411 Generalized anxiety disorder: Secondary | ICD-10-CM | POA: Diagnosis not present

## 2021-11-08 DIAGNOSIS — R69 Illness, unspecified: Secondary | ICD-10-CM | POA: Diagnosis not present

## 2021-11-29 DIAGNOSIS — F331 Major depressive disorder, recurrent, moderate: Secondary | ICD-10-CM | POA: Diagnosis not present

## 2021-11-29 DIAGNOSIS — F411 Generalized anxiety disorder: Secondary | ICD-10-CM | POA: Diagnosis not present

## 2021-11-29 DIAGNOSIS — R69 Illness, unspecified: Secondary | ICD-10-CM | POA: Diagnosis not present

## 2021-12-27 DIAGNOSIS — F331 Major depressive disorder, recurrent, moderate: Secondary | ICD-10-CM | POA: Diagnosis not present

## 2021-12-27 DIAGNOSIS — R69 Illness, unspecified: Secondary | ICD-10-CM | POA: Diagnosis not present

## 2021-12-27 DIAGNOSIS — F411 Generalized anxiety disorder: Secondary | ICD-10-CM | POA: Diagnosis not present

## 2022-01-14 DIAGNOSIS — N400 Enlarged prostate without lower urinary tract symptoms: Secondary | ICD-10-CM | POA: Diagnosis not present

## 2022-01-14 DIAGNOSIS — N503 Cyst of epididymis: Secondary | ICD-10-CM | POA: Diagnosis not present

## 2022-01-14 DIAGNOSIS — F331 Major depressive disorder, recurrent, moderate: Secondary | ICD-10-CM | POA: Diagnosis not present

## 2022-01-14 DIAGNOSIS — F411 Generalized anxiety disorder: Secondary | ICD-10-CM | POA: Diagnosis not present

## 2022-01-14 DIAGNOSIS — R69 Illness, unspecified: Secondary | ICD-10-CM | POA: Diagnosis not present

## 2022-01-24 DIAGNOSIS — F331 Major depressive disorder, recurrent, moderate: Secondary | ICD-10-CM | POA: Diagnosis not present

## 2022-01-24 DIAGNOSIS — R69 Illness, unspecified: Secondary | ICD-10-CM | POA: Diagnosis not present

## 2022-01-24 DIAGNOSIS — F411 Generalized anxiety disorder: Secondary | ICD-10-CM | POA: Diagnosis not present

## 2022-01-31 DIAGNOSIS — N400 Enlarged prostate without lower urinary tract symptoms: Secondary | ICD-10-CM | POA: Diagnosis not present

## 2022-03-07 DIAGNOSIS — F411 Generalized anxiety disorder: Secondary | ICD-10-CM | POA: Diagnosis not present

## 2022-03-07 DIAGNOSIS — F331 Major depressive disorder, recurrent, moderate: Secondary | ICD-10-CM | POA: Diagnosis not present

## 2022-03-07 DIAGNOSIS — R69 Illness, unspecified: Secondary | ICD-10-CM | POA: Diagnosis not present

## 2022-03-29 DIAGNOSIS — M25561 Pain in right knee: Secondary | ICD-10-CM | POA: Diagnosis not present

## 2022-04-18 DIAGNOSIS — R69 Illness, unspecified: Secondary | ICD-10-CM | POA: Diagnosis not present

## 2022-04-18 DIAGNOSIS — F411 Generalized anxiety disorder: Secondary | ICD-10-CM | POA: Diagnosis not present

## 2022-04-18 DIAGNOSIS — F331 Major depressive disorder, recurrent, moderate: Secondary | ICD-10-CM | POA: Diagnosis not present

## 2022-04-22 DIAGNOSIS — R69 Illness, unspecified: Secondary | ICD-10-CM | POA: Diagnosis not present

## 2022-04-22 DIAGNOSIS — F411 Generalized anxiety disorder: Secondary | ICD-10-CM | POA: Diagnosis not present

## 2022-04-22 DIAGNOSIS — F331 Major depressive disorder, recurrent, moderate: Secondary | ICD-10-CM | POA: Diagnosis not present

## 2022-05-08 DIAGNOSIS — H2513 Age-related nuclear cataract, bilateral: Secondary | ICD-10-CM | POA: Diagnosis not present

## 2022-05-08 DIAGNOSIS — H43391 Other vitreous opacities, right eye: Secondary | ICD-10-CM | POA: Diagnosis not present

## 2022-05-08 DIAGNOSIS — H35053 Retinal neovascularization, unspecified, bilateral: Secondary | ICD-10-CM | POA: Diagnosis not present

## 2022-05-08 DIAGNOSIS — H43813 Vitreous degeneration, bilateral: Secondary | ICD-10-CM | POA: Diagnosis not present

## 2022-05-14 DIAGNOSIS — M5451 Vertebrogenic low back pain: Secondary | ICD-10-CM | POA: Diagnosis not present

## 2022-05-16 DIAGNOSIS — R69 Illness, unspecified: Secondary | ICD-10-CM | POA: Diagnosis not present

## 2022-05-16 DIAGNOSIS — F331 Major depressive disorder, recurrent, moderate: Secondary | ICD-10-CM | POA: Diagnosis not present

## 2022-05-16 DIAGNOSIS — F411 Generalized anxiety disorder: Secondary | ICD-10-CM | POA: Diagnosis not present

## 2022-06-12 IMAGING — CT CT CARDIAC CORONARY ARTERY CALCIUM SCORE
3 series · 14 of 20 positions shown, 15 images · non-contrast
Comparison: None.
COMPARISON: None.

Addendum:
EXAM:
OVER-READ INTERPRETATION  CT CHEST

The following report is an over-read performed by radiologist Dr.
Jhobani Pillimue [REDACTED] on 04/17/2020. This
over-read does not include interpretation of cardiac or coronary
anatomy or pathology. The coronary calcium score interpretation by
the cardiologist is attached.
CLINICAL DATA: Risk stratification, CAD eval, asymptomatic, family
hx
Coronary Calcium Score
TECHNIQUE: The patient was scanned on a Siemens Force scanner. Axial
non-contrast 3 mm slices were carried out through the heart. The
data set was analyzed on a dedicated work station and scored using
the Agatson method.

[Series 2: casc 3.0 bv41 2 bestdiast 70 % · axial · 0.37mm/px · z∈[-278,-197]mm · 4 of 47 slices shown, 5 images]
[im 10/47  vessel]
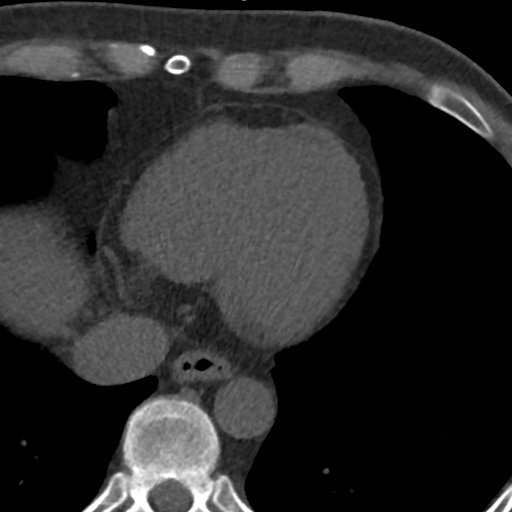
[im 10/47  lung]
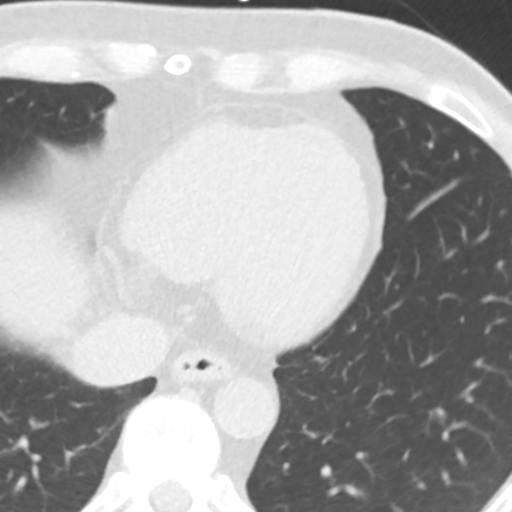
[im 19/47  vessel]
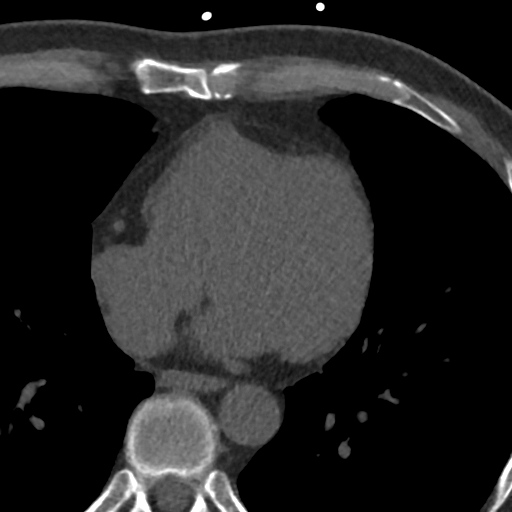
[im 28/47  vessel]
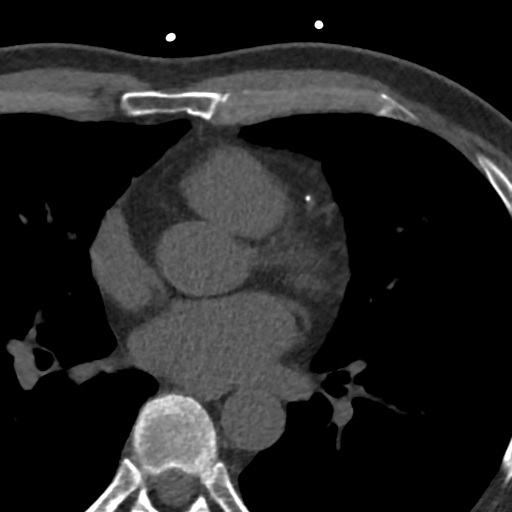
[im 37/47  vessel]
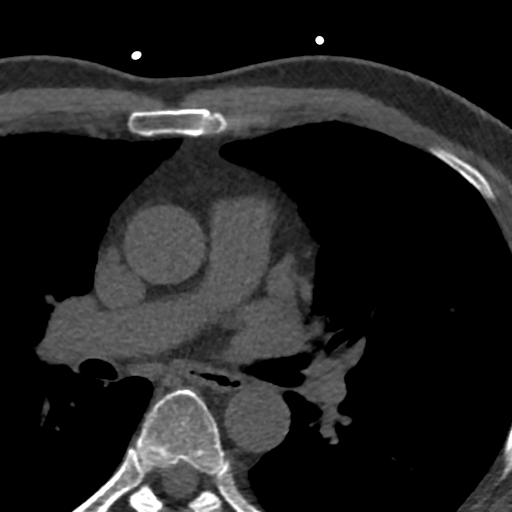

[Series 3: lung 69 % · axial · 0.68mm/px · z∈[-284,-191]mm · 5 of 47 slices shown]
[im 8/47  lung]
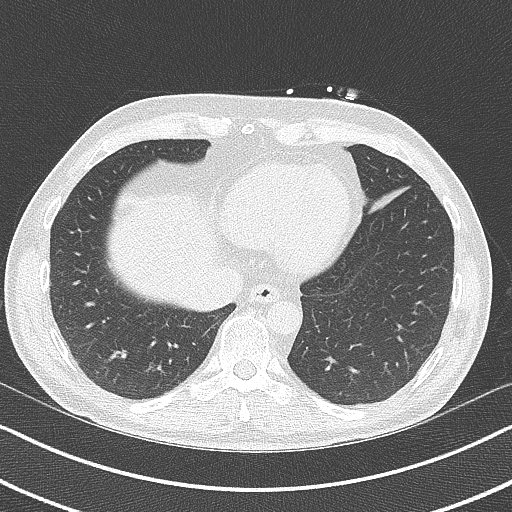
[im 16/47  lung]
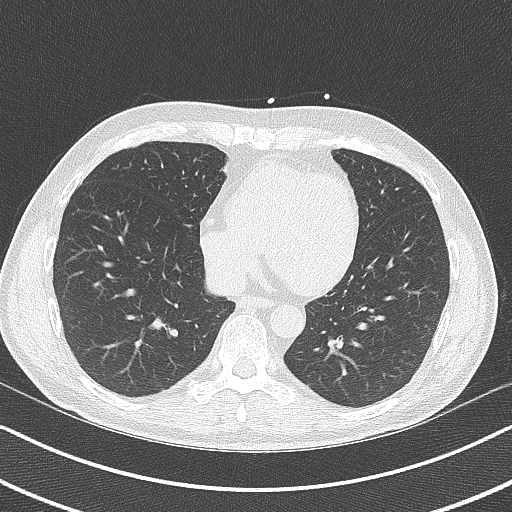
[im 24/47  lung]
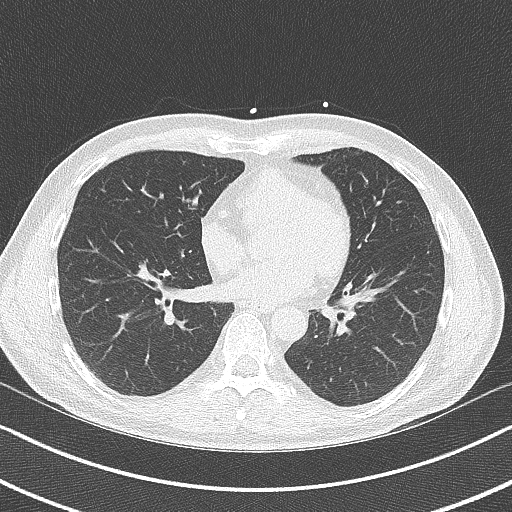
[im 31/47  lung]
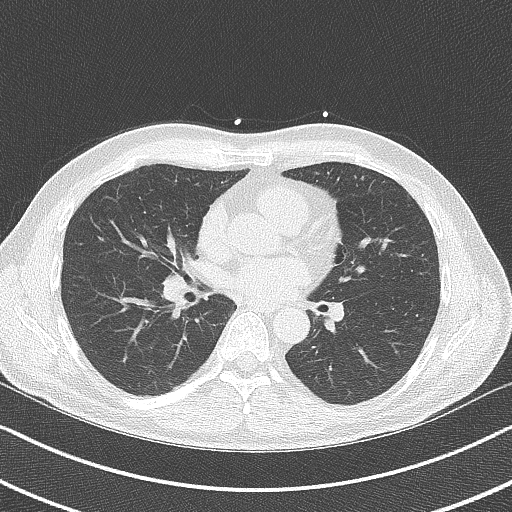
[im 39/47  lung]
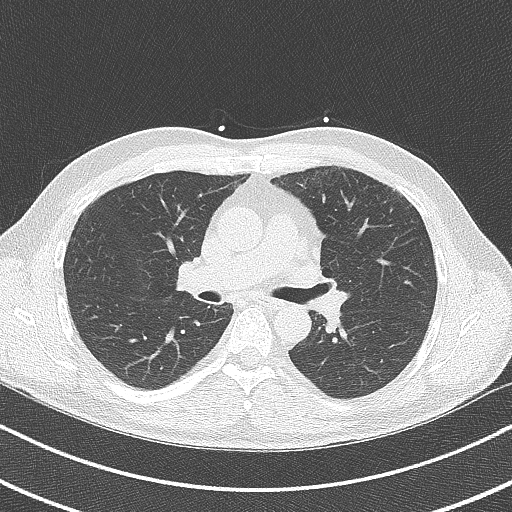

[Series 4: lung st 69 % · axial · 0.68mm/px · z∈[-284,-191]mm · 5 of 47 slices shown]
[im 8/47  lung]
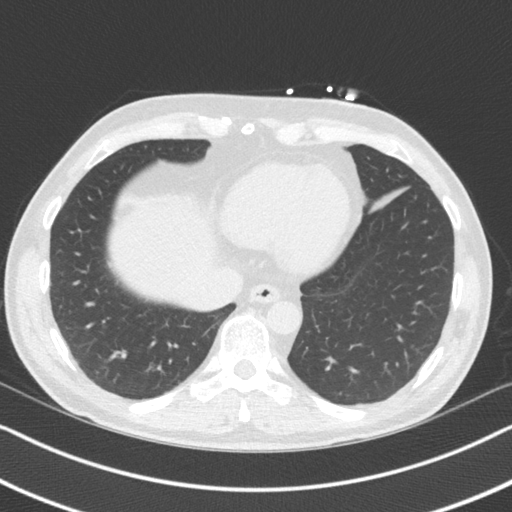
[im 16/47  lung]
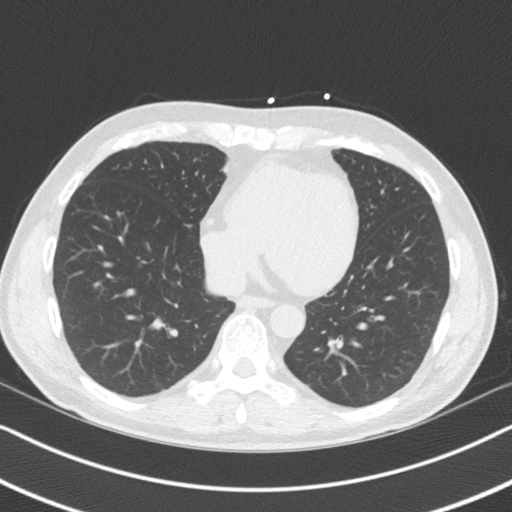
[im 24/47  lung]
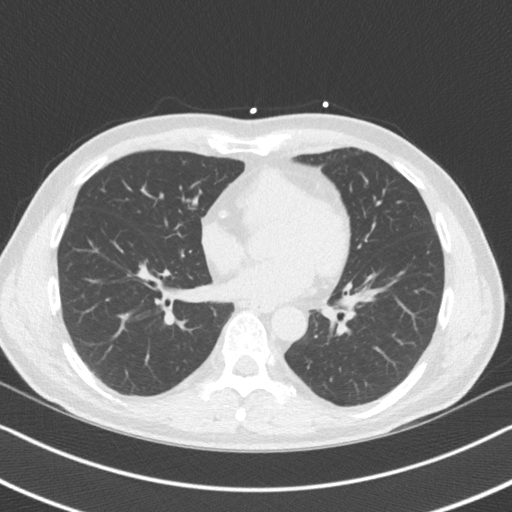
[im 31/47  lung]
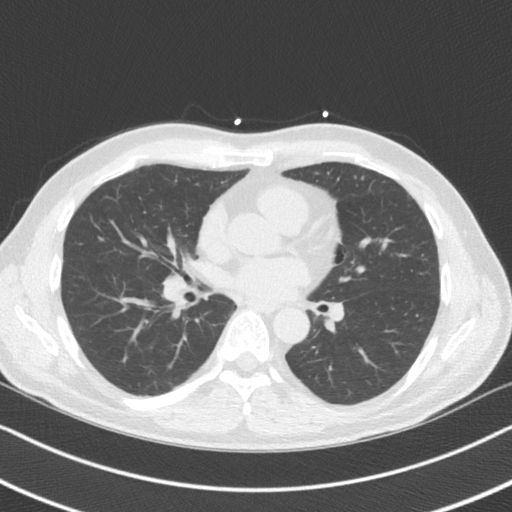
[im 39/47  lung]
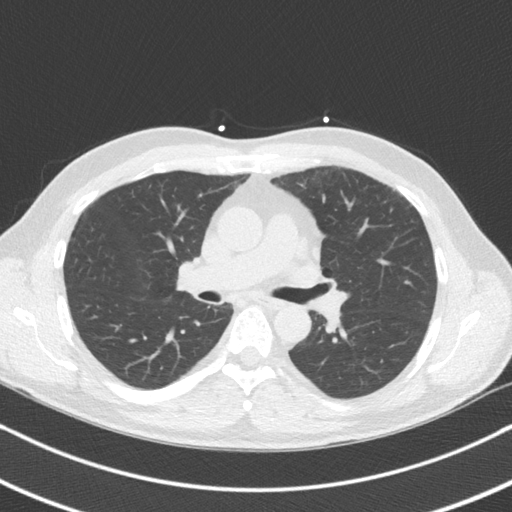

[14 of 20 positions shown; findings below may reference images not displayed]

FINDINGS: Within the visualized portions of the thorax there are no suspicious
appearing pulmonary nodules or masses, there is no acute
consolidative airspace disease, no pleural effusions, no
pneumothorax and no lymphadenopathy. Visualized portions of the
upper abdomen demonstrates mild diffuse low attenuation throughout
the visualized hepatic parenchyma, indicative of hepatic steatosis.
There are no aggressive appearing lytic or blastic lesions noted in
the visualized portions of the skeleton.
IMPRESSION: 1. Hepatic steatosis.
FINDINGS: Non-cardiac: See separate report from [REDACTED].

Ascending Aorta: 32 mm at the mid ascending aorta measured in an
axial plane.

Pericardium: Normal

Coronary arteries: Normal coronary artery origins.

Coronary calcium score of 17. This was 49th percentile for age and
sex matched control.
IMPRESSION: Coronary calcium score of 17. This was 49th percentile for age and
sex matched control. Calcium noted in LAD and RCA.

*** End of Addendum ***
EXAM:
OVER-READ INTERPRETATION  CT CHEST

The following report is an over-read performed by radiologist Dr.
Jhobani Pillimue [REDACTED] on 04/17/2020. This
over-read does not include interpretation of cardiac or coronary
anatomy or pathology. The coronary calcium score interpretation by
the cardiologist is attached.
FINDINGS: Within the visualized portions of the thorax there are no suspicious
appearing pulmonary nodules or masses, there is no acute
consolidative airspace disease, no pleural effusions, no
pneumothorax and no lymphadenopathy. Visualized portions of the
upper abdomen demonstrates mild diffuse low attenuation throughout
the visualized hepatic parenchyma, indicative of hepatic steatosis.
There are no aggressive appearing lytic or blastic lesions noted in
the visualized portions of the skeleton.
IMPRESSION: 1. Hepatic steatosis.

## 2022-06-13 DIAGNOSIS — F331 Major depressive disorder, recurrent, moderate: Secondary | ICD-10-CM | POA: Diagnosis not present

## 2022-06-13 DIAGNOSIS — F411 Generalized anxiety disorder: Secondary | ICD-10-CM | POA: Diagnosis not present

## 2022-06-13 DIAGNOSIS — R69 Illness, unspecified: Secondary | ICD-10-CM | POA: Diagnosis not present

## 2022-06-18 DIAGNOSIS — M65331 Trigger finger, right middle finger: Secondary | ICD-10-CM | POA: Diagnosis not present

## 2022-06-20 DIAGNOSIS — M25561 Pain in right knee: Secondary | ICD-10-CM | POA: Diagnosis not present

## 2022-06-27 DIAGNOSIS — K760 Fatty (change of) liver, not elsewhere classified: Secondary | ICD-10-CM | POA: Diagnosis not present

## 2022-06-27 DIAGNOSIS — Z87891 Personal history of nicotine dependence: Secondary | ICD-10-CM | POA: Diagnosis not present

## 2022-06-27 DIAGNOSIS — E7800 Pure hypercholesterolemia, unspecified: Secondary | ICD-10-CM | POA: Diagnosis not present

## 2022-06-27 DIAGNOSIS — Z0000 Encounter for general adult medical examination without abnormal findings: Secondary | ICD-10-CM | POA: Diagnosis not present

## 2022-07-02 ENCOUNTER — Other Ambulatory Visit (HOSPITAL_BASED_OUTPATIENT_CLINIC_OR_DEPARTMENT_OTHER): Payer: Self-pay | Admitting: Family Medicine

## 2022-07-02 DIAGNOSIS — Z87891 Personal history of nicotine dependence: Secondary | ICD-10-CM

## 2022-07-08 DIAGNOSIS — F331 Major depressive disorder, recurrent, moderate: Secondary | ICD-10-CM | POA: Diagnosis not present

## 2022-07-08 DIAGNOSIS — F411 Generalized anxiety disorder: Secondary | ICD-10-CM | POA: Diagnosis not present

## 2022-07-08 DIAGNOSIS — R69 Illness, unspecified: Secondary | ICD-10-CM | POA: Diagnosis not present

## 2022-07-29 DIAGNOSIS — F331 Major depressive disorder, recurrent, moderate: Secondary | ICD-10-CM | POA: Diagnosis not present

## 2022-07-29 DIAGNOSIS — R69 Illness, unspecified: Secondary | ICD-10-CM | POA: Diagnosis not present

## 2022-07-29 DIAGNOSIS — F411 Generalized anxiety disorder: Secondary | ICD-10-CM | POA: Diagnosis not present

## 2022-08-08 DIAGNOSIS — F411 Generalized anxiety disorder: Secondary | ICD-10-CM | POA: Diagnosis not present

## 2022-08-08 DIAGNOSIS — R69 Illness, unspecified: Secondary | ICD-10-CM | POA: Diagnosis not present

## 2022-08-08 DIAGNOSIS — F331 Major depressive disorder, recurrent, moderate: Secondary | ICD-10-CM | POA: Diagnosis not present

## 2022-08-14 ENCOUNTER — Ambulatory Visit (HOSPITAL_BASED_OUTPATIENT_CLINIC_OR_DEPARTMENT_OTHER)
Admission: RE | Admit: 2022-08-14 | Discharge: 2022-08-14 | Disposition: A | Discharge status: Home / Self Care | Payer: Medicare HMO | Source: Ambulatory Visit | Attending: Family Medicine | Admitting: Family Medicine

## 2022-08-14 DIAGNOSIS — Z87891 Personal history of nicotine dependence: Secondary | ICD-10-CM | POA: Insufficient documentation

## 2022-09-06 DIAGNOSIS — F411 Generalized anxiety disorder: Secondary | ICD-10-CM | POA: Diagnosis not present

## 2022-09-06 DIAGNOSIS — F331 Major depressive disorder, recurrent, moderate: Secondary | ICD-10-CM | POA: Diagnosis not present

## 2022-09-06 DIAGNOSIS — R69 Illness, unspecified: Secondary | ICD-10-CM | POA: Diagnosis not present

## 2022-09-18 DIAGNOSIS — F331 Major depressive disorder, recurrent, moderate: Secondary | ICD-10-CM | POA: Diagnosis not present

## 2022-09-18 DIAGNOSIS — R69 Illness, unspecified: Secondary | ICD-10-CM | POA: Diagnosis not present

## 2022-09-18 DIAGNOSIS — F411 Generalized anxiety disorder: Secondary | ICD-10-CM | POA: Diagnosis not present

## 2022-09-26 DIAGNOSIS — R5383 Other fatigue: Secondary | ICD-10-CM | POA: Diagnosis not present

## 2022-09-26 DIAGNOSIS — Z125 Encounter for screening for malignant neoplasm of prostate: Secondary | ICD-10-CM | POA: Diagnosis not present

## 2022-09-26 DIAGNOSIS — L729 Follicular cyst of the skin and subcutaneous tissue, unspecified: Secondary | ICD-10-CM | POA: Diagnosis not present

## 2022-09-26 DIAGNOSIS — H3530 Unspecified macular degeneration: Secondary | ICD-10-CM | POA: Diagnosis not present

## 2022-09-26 DIAGNOSIS — E782 Mixed hyperlipidemia: Secondary | ICD-10-CM | POA: Diagnosis not present

## 2022-09-26 DIAGNOSIS — M1710 Unilateral primary osteoarthritis, unspecified knee: Secondary | ICD-10-CM | POA: Diagnosis not present

## 2022-09-26 DIAGNOSIS — F3181 Bipolar II disorder: Secondary | ICD-10-CM | POA: Diagnosis not present

## 2022-09-26 DIAGNOSIS — F419 Anxiety disorder, unspecified: Secondary | ICD-10-CM | POA: Diagnosis not present

## 2022-09-26 DIAGNOSIS — R7301 Impaired fasting glucose: Secondary | ICD-10-CM | POA: Diagnosis not present

## 2022-09-26 DIAGNOSIS — H548 Legal blindness, as defined in USA: Secondary | ICD-10-CM | POA: Diagnosis not present

## 2022-09-26 DIAGNOSIS — Q828 Other specified congenital malformations of skin: Secondary | ICD-10-CM | POA: Diagnosis not present

## 2024-09-03 ENCOUNTER — Encounter (HOSPITAL_COMMUNITY): Payer: Self-pay | Admitting: General Surgery
